# Patient Record
Sex: Female | Born: 1985 | Race: Black or African American | Hispanic: No | Marital: Married | Smoking: Current every day smoker
Health system: Southern US, Community
[De-identification: ages and names within clinical notes are randomized; demographics above are authoritative.]

## PROBLEM LIST (undated history)

## (undated) DIAGNOSIS — N159 Renal tubulo-interstitial disease, unspecified: Secondary | ICD-10-CM

## (undated) DIAGNOSIS — A0472 Enterocolitis due to Clostridium difficile, not specified as recurrent: Secondary | ICD-10-CM

---

## 2008-03-29 ENCOUNTER — Emergency Department (HOSPITAL_COMMUNITY): Admission: EM | Admit: 2008-03-29 | Discharge: 2008-03-29 | Payer: Self-pay | Admitting: Emergency Medicine

## 2008-05-18 ENCOUNTER — Ambulatory Visit (HOSPITAL_COMMUNITY): Admission: RE | Admit: 2008-05-18 | Discharge: 2008-05-18 | Payer: Self-pay | Admitting: Family Medicine

## 2008-06-15 ENCOUNTER — Ambulatory Visit: Payer: Self-pay | Admitting: *Deleted

## 2008-06-15 ENCOUNTER — Inpatient Hospital Stay (HOSPITAL_COMMUNITY): Admission: AD | Admit: 2008-06-15 | Discharge: 2008-06-15 | Payer: Self-pay | Admitting: Obstetrics & Gynecology

## 2008-08-13 ENCOUNTER — Inpatient Hospital Stay (HOSPITAL_COMMUNITY): Admission: AD | Admit: 2008-08-13 | Discharge: 2008-08-13 | Payer: Self-pay | Admitting: Obstetrics and Gynecology

## 2008-09-22 ENCOUNTER — Inpatient Hospital Stay (HOSPITAL_COMMUNITY): Admission: AD | Admit: 2008-09-22 | Discharge: 2008-09-22 | Payer: Self-pay | Admitting: Obstetrics and Gynecology

## 2008-10-12 ENCOUNTER — Inpatient Hospital Stay (HOSPITAL_COMMUNITY): Admission: AD | Admit: 2008-10-12 | Discharge: 2008-10-14 | Payer: Self-pay | Admitting: Obstetrics and Gynecology

## 2008-10-12 ENCOUNTER — Encounter (INDEPENDENT_AMBULATORY_CARE_PROVIDER_SITE_OTHER): Payer: Self-pay | Admitting: Obstetrics and Gynecology

## 2011-04-19 ENCOUNTER — Emergency Department (HOSPITAL_COMMUNITY)
Admission: EM | Admit: 2011-04-19 | Discharge: 2011-04-20 | Discharge status: Against Medical Advice | Payer: Self-pay | Attending: Emergency Medicine | Admitting: Emergency Medicine

## 2011-04-19 DIAGNOSIS — J111 Influenza due to unidentified influenza virus with other respiratory manifestations: Secondary | ICD-10-CM | POA: Insufficient documentation

## 2011-05-05 NOTE — H&P (Signed)
Olivia, Adams              ACCOUNT NO.:  [id number]   MEDICAL RECORD NO.:  [id number]          PATIENT TYPE:  INP   LOCATION:  9175                          FACILITY:  [facility]   PHYSICIAN:  Crist Fat. Rivard, M.D. DATE OF BIRTH:  1986/04/30   DATE OF ADMISSION:  10/12/2008  DATE OF DISCHARGE:                              HISTORY & PHYSICAL   Olivia Adams is a 25 year old gravida 1, para 0 at 40-3/7 weeks who  presents today with uterine contractions every 5 minutes x several  hours.  She denies any leaking or bleeding, and reports positive fetal  movement.  Pregnancy has been remarkable for:   1. Mild polyhydramnios noted on ultrasound last week.  2. Marginal cord insertion with normal growth on ultrasound.  3. Former smoker.  4. Negative group B strep.   PRENATAL LABORATORIES:  Blood type is O+, Rh antibody negative, VDRL  nonreactive, rubella titer positive, hepatitis B surface antigen  negative.  Hemoglobin electrophoresis was normal.  Varicella titer was  immune.  HIV was nonreactive.  GC and chlamydia cultures were negative  in May and at 36 weeks.  Pap was normal in May.  Urine culture in May  was normal.  Quadruple screen was within normal limits.  Hemoglobin upon  entering the practice was 10.8.  It was 10.4 at 28 weeks.  Glucola was  normal.  Group B strep culture was negative at 36 weeks.   HISTORY OF PRESENT PREGNANCY:  The patient transferred to [facility] for care in July at approximately 27 weeks. Previous to that  she had been followed at the [facility].  She had  originally presented for care there at 19 weeks.  She had an ultrasound  showing a marginal cord insertion.  She was treated for yeast and a UTI  at 23 weeks.  She had  another ultrasound through the health department.  She had initial ultrasound in April at 12 weeks 2 days showing a  marginal placenta previa.  On May 29th she had another ultrasound at 19  weeks and 3  days showing normal growth and marginal cord insertion was  noted and the previa was noted to be resolved.  She then transferred to  [facility].  She had a Glucola which was normal.  She had  had another ultrasound at 30 weeks showing growth at the 51st percentile  and fluid at the 55th percentile, normal cervical length, and the  marginal cord insertion was still noted.  She had another ultrasound at  34-6/7 weeks with normal growth, normal fluid, and marginal cord  insertion still noted.  Group B strep culture was negative.  Her last  ultrasound was last week showing mild polyhydramnios and normal growth.   OBSTETRICAL HISTORY:  The patient is a primigravida.   MEDICAL HISTORY:  She reports the usual childhood illnesses.  She is a  previous Lo Ovral and condom user.  She has had yeast infections x1.  She has a history of a kidney infection high school.   ALLERGIES:  She has no known medication  allergies.   FAMILY HISTORY:  Maternal grandfather had congestive heart failure.  Paternal grandmother had congestive heart failure.  Maternal  grandfather, maternal grandmother, maternal aunt and uncle have  hypertension.  Paternal grandfather is anemic, maternal aunt has  emphysema.  Maternal grandfather, maternal aunt and uncle are type 2  diabetics.  Maternal 1st cousin is hypothyroid.  Her mother had fainting  spells when younger; had surgery on the brain stem  which put pressure  on the spinal cord.  Maternal aunt had HIV.  Maternal aunt also had some  type of cancer.  Paternal aunt had questionable breast cancer.  Her  father was a smoker.   SOCIAL HISTORY:  The patient is single.  The father of the baby is  involved and supportive.  His name is [name].  The patient is a  Holiday representative in college.  Her partner has a bachelor's degree.  He is  currently employed.  The patient is [place] of the [place]  faith.  She is a former smoker.  She denies any alcohol,  drug or tobacco  since knowing she was pregnant.   PHYSICAL EXAMINATION:  VITAL SIGNS:  Stable.  The patient is afebrile.  HEENT:  Within normal limits.  LUNGS:  Breath sounds are clear.  HEART:  Regular rate and rhythm without murmur.  BREASTS:  Soft and nontender.  ABDOMEN:  Fundal height is approximately 38 cm.  Estimated fetal weight  7 to 7-1/2 pounds.  Uterine contractions are every 5 minutes, moderate  quality.  Cervix is 3-4 cm, 100% vertex at a -1 station and well-applied  with bulging bag of water.  Fetal heart rate is reactive with no  decelerations.  EXTREMITIES:  Deep tendon reflexes are 2+ without clonus.  There is a  trace edema noted.   IMPRESSION:  1. Intrauterine pregnancy at 40-3/7 weeks.  2. Early labor.  3. Negative group B strep.  4. Mild polyhydramnios.   PLAN:  1. Admit to birthing suite for consult with Dr. Silverio Lay as      attending physician.  2. Routine certified nurse midwife orders.  3. Will plan placement of epidural per patient request and then      artificial rupture of membranes after that.      Renaldo Reel Emilee Hero, C.N.M.      Crist Fat Rivard, M.D.  Electronically Signed    VLL/MEDQ  D:  10/12/2008  T:  10/12/2008  Job:  [id number]

## 2011-09-17 LAB — URINALYSIS, ROUTINE W REFLEX MICROSCOPIC
Glucose, UA: NEGATIVE
Hgb urine dipstick: NEGATIVE
Specific Gravity, Urine: 1.015

## 2011-09-17 LAB — GC/CHLAMYDIA PROBE AMP, GENITAL: GC Probe Amp, Genital: NEGATIVE

## 2011-09-17 LAB — WET PREP, GENITAL
Clue Cells Wet Prep HPF POC: NONE SEEN
Trich, Wet Prep: NONE SEEN
Yeast Wet Prep HPF POC: NONE SEEN

## 2011-09-21 LAB — RPR: RPR Ser Ql: NONREACTIVE

## 2011-09-21 LAB — CBC
HCT: 29.3 — ABNORMAL LOW
MCHC: 33.4
Platelets: 154
Platelets: 184
RBC: 3.68 — ABNORMAL LOW
WBC: 6.8
WBC: 8.2

## 2011-09-21 LAB — URINE MICROSCOPIC-ADD ON

## 2011-09-21 LAB — URINALYSIS, ROUTINE W REFLEX MICROSCOPIC
Glucose, UA: NEGATIVE
Hgb urine dipstick: NEGATIVE
Specific Gravity, Urine: <1.005 — ABNORMAL LOW

## 2012-12-08 ENCOUNTER — Encounter (HOSPITAL_COMMUNITY): Payer: Self-pay

## 2012-12-08 ENCOUNTER — Emergency Department (HOSPITAL_COMMUNITY)
Admission: EM | Admit: 2012-12-08 | Discharge: 2012-12-08 | Disposition: A | Discharge status: Home / Self Care | Payer: 59 | Attending: Emergency Medicine | Admitting: Emergency Medicine

## 2012-12-08 DIAGNOSIS — F17200 Nicotine dependence, unspecified, uncomplicated: Secondary | ICD-10-CM | POA: Insufficient documentation

## 2012-12-08 DIAGNOSIS — N390 Urinary tract infection, site not specified: Secondary | ICD-10-CM | POA: Insufficient documentation

## 2012-12-08 DIAGNOSIS — Z975 Presence of (intrauterine) contraceptive device: Secondary | ICD-10-CM | POA: Insufficient documentation

## 2012-12-08 DIAGNOSIS — R1031 Right lower quadrant pain: Secondary | ICD-10-CM | POA: Insufficient documentation

## 2012-12-08 DIAGNOSIS — Z87448 Personal history of other diseases of urinary system: Secondary | ICD-10-CM | POA: Insufficient documentation

## 2012-12-08 DIAGNOSIS — R109 Unspecified abdominal pain: Secondary | ICD-10-CM

## 2012-12-08 HISTORY — DX: Renal tubulo-interstitial disease, unspecified: N15.9

## 2012-12-08 LAB — CBC WITH DIFFERENTIAL/PLATELET
Hemoglobin: 13 g/dL (ref 12.0–15.0)
Lymphocytes Relative: 47 % — ABNORMAL HIGH (ref 12–46)
Lymphs Abs: 2.5 10*3/uL (ref 0.7–4.0)
MCH: 31.5 pg (ref 26.0–34.0)
Monocytes Relative: 7 % (ref 3–12)
Neutro Abs: 2.4 10*3/uL (ref 1.7–7.7)
Neutrophils Relative %: 44 % (ref 43–77)
Platelets: 278 10*3/uL (ref 150–400)
RBC: 4.13 MIL/uL (ref 3.87–5.11)
WBC: 5.4 10*3/uL (ref 4.0–10.5)

## 2012-12-08 LAB — URINALYSIS, ROUTINE W REFLEX MICROSCOPIC
Bilirubin Urine: NEGATIVE
Nitrite: POSITIVE — AB
Specific Gravity, Urine: 1.019 (ref 1.005–1.030)
pH: 7.5 (ref 5.0–8.0)

## 2012-12-08 LAB — COMPREHENSIVE METABOLIC PANEL
ALT: 11 U/L (ref 0–35)
Alkaline Phosphatase: 62 U/L (ref 39–117)
BUN: 10 mg/dL (ref 6–23)
CO2: 25 mEq/L (ref 19–32)
Chloride: 106 mEq/L (ref 96–112)
GFR calc Af Amer: >90 mL/min (ref >90)
Glucose, Bld: 94 mg/dL (ref 70–99)
Potassium: 3.2 mEq/L — ABNORMAL LOW (ref 3.5–5.1)
Sodium: 140 mEq/L (ref 135–145)
Total Bilirubin: 0.4 mg/dL (ref 0.3–1.2)

## 2012-12-08 LAB — WET PREP, GENITAL
Clue Cells Wet Prep HPF POC: NONE SEEN
Trich, Wet Prep: NONE SEEN
Yeast Wet Prep HPF POC: NONE SEEN

## 2012-12-08 LAB — GC/CHLAMYDIA PROBE AMP
CT Probe RNA: NEGATIVE
GC Probe RNA: NEGATIVE

## 2012-12-08 LAB — URINE MICROSCOPIC-ADD ON

## 2012-12-08 MED ORDER — NITROFURANTOIN MONOHYD MACRO 100 MG PO CAPS: 100.0000 mg | ORAL_CAPSULE | Freq: Two times a day (BID) | ORAL | Status: DC

## 2012-12-08 MED ORDER — POTASSIUM CHLORIDE CRYS ER 20 MEQ PO TBCR
40.0000 meq | EXTENDED_RELEASE_TABLET | Freq: Once | ORAL | Status: AC
  Administered 2012-12-08: 40 meq via ORAL
  Filled 2012-12-08: qty 2

## 2012-12-08 NOTE — ED Notes (Signed)
Pt states she has an IUD x 4 years.  Pt states had rt back pain yesterday and what felt like kidney pain.  Pt states drank a lot of water.  That pain has eased up.  Today states that she went out and had pizza.  Had intercourse and about 15 minutes later started having severe abdominal pain.  No spotting or bleeding.  No fever.  Odor with urination noted.

## 2012-12-08 NOTE — ED Notes (Signed)
YNW:[id number]<[id number]> Expected date:<BR> Expected time:<BR> Means of arrival:<BR> Comments:<BR> BED 7

## 2012-12-08 NOTE — ED Provider Notes (Signed)
History     CSN: [id number]  Arrival date & time 12/08/12  0129   First MD Initiated Contact with Patient 12/08/12 [phone]      Chief Complaint  Patient presents with  . Abdominal Pain    (Consider location/radiation/quality/duration/timing/severity/associated sxs/prior treatment) HPI Comments: Ms. Olivia Adams presents for evaluation of lower abdominal pain.  She first experienced pain 2 days ago along her right back and flank.  It was dull and resolved after several hours.  Tonight she had sexual intercourse.  Immediately afterwards she reports severe, sharp right lower abdominal pain.  She denies fever, NVD, constipation, dysuria, flank/back pain, abnormal discharges, vaginal bleeding, and hematuria.  She has a mirena IUD in place and has not had a regular period in over 4 years.  Patient is a 26 y.o. female presenting with abdominal pain. The history is provided by the patient. No language interpreter was used.  Abdominal Pain The primary symptoms of the illness include abdominal pain. The primary symptoms of the illness do not include fever, fatigue, shortness of breath, nausea, vomiting, diarrhea, hematemesis, hematochezia, dysuria, vaginal discharge or vaginal bleeding. The onset of the illness was sudden. The problem has been gradually improving.  The illness is associated with recent sexual activity. The patient states that she believes she is currently not pregnant. The patient has not had a change in bowel habit. Symptoms associated with the illness do not include chills, anorexia, diaphoresis, heartburn, constipation, urgency, hematuria, frequency or back pain.    Past Medical History  Diagnosis Date  . Kidney infection     History reviewed. No pertinent past surgical history.  History reviewed. No pertinent family history.  History  Substance Use Topics  . Smoking status: Current Every Day Smoker -- 0.5 packs/day    Types: Cigarettes  . Smokeless tobacco: Not on file  .  Alcohol Use: Yes     Comment: daily    OB History    Grav Para Term Preterm Abortions TAB SAB Ect Mult Living                  Review of Systems  Constitutional: Negative for fever, chills, diaphoresis and fatigue.  Respiratory: Negative for shortness of breath.   Gastrointestinal: Positive for abdominal pain. Negative for heartburn, nausea, vomiting, diarrhea, constipation, hematochezia, anorexia and hematemesis.  Genitourinary: Negative for dysuria, urgency, frequency, hematuria, vaginal bleeding and vaginal discharge.  Musculoskeletal: Negative for back pain.  All other systems reviewed and are negative.    Allergies  Review of patient's allergies indicates no known allergies.  Home Medications   Current Outpatient Rx  Name  Route  Sig  Dispense  Refill  . LEVONORGESTREL 20 MCG/24HR IU IUD   Intrauterine   1 each by Intrauterine route once.           BP 115/73  Pulse 111  Temp 98.9 F (37.2 C) (Oral)  Resp 22  SpO2 100%  Physical Exam  Nursing note and vitals reviewed. Constitutional: She is oriented to person, place, and time. She appears well-developed and well-nourished. No distress.  HENT:  Head: Normocephalic and atraumatic.  Right Ear: External ear normal.  Left Ear: External ear normal.  Nose: Nose normal.  Mouth/Throat: Oropharynx is clear and moist. No oropharyngeal exudate.  Eyes: Conjunctivae normal are normal. Pupils are equal, round, and reactive to light. Right eye exhibits no discharge. Left eye exhibits no discharge. No scleral icterus.  Neck: Normal range of motion. Neck supple. No JVD present. No tracheal  deviation present.  Cardiovascular: Normal rate, regular rhythm, normal heart sounds and intact distal pulses.  Exam reveals no gallop and no friction rub.   No murmur heard. Pulmonary/Chest: Effort normal and breath sounds normal. No stridor. No respiratory distress. She has no wheezes. She has no rales. She exhibits no tenderness.   Abdominal: Soft. Bowel sounds are normal. She exhibits no distension and no mass. There is no hepatosplenomegaly. There is tenderness in the right lower quadrant. There is no rigidity, no rebound, no guarding, no CVA tenderness and negative Murphy's sign. No hernia. Hernia confirmed negative in the right inguinal area and confirmed negative in the left inguinal area.  Genitourinary: Vagina normal and uterus normal. Rectal exam shows no external hemorrhoid. No labial fusion. There is no rash, tenderness, lesion or injury on the right labia. There is no rash, tenderness, lesion or injury on the left labia. Uterus is not deviated, not enlarged, not fixed and not tender. Cervix exhibits no motion tenderness and no discharge. Right adnexum displays no mass, no tenderness and no fullness. Left adnexum displays no mass, no tenderness and no fullness. No erythema, tenderness or bleeding around the vagina. No foreign body around the vagina. No signs of injury around the vagina. No vaginal discharge found.  Musculoskeletal: Normal range of motion. She exhibits no edema and no tenderness.  Lymphadenopathy:    She has no cervical adenopathy.       Right: No inguinal adenopathy present.       Left: No inguinal adenopathy present.  Neurological: She is alert and oriented to person, place, and time. No cranial nerve deficit.  Skin: Skin is warm and dry. No rash noted. She is not diaphoretic. No erythema. No pallor.  Psychiatric: She has a normal mood and affect. Her behavior is normal.    ED Course  Procedures (including critical care time)  Labs Reviewed  URINALYSIS, ROUTINE W REFLEX MICROSCOPIC - Abnormal; Notable for the following:    APPearance CLOUDY (*)     Hgb urine dipstick SMALL (*)     Ketones, ur TRACE (*)     Nitrite POSITIVE (*)     Leukocytes, UA MODERATE (*)     All other components within normal limits  CBC WITH DIFFERENTIAL - Abnormal; Notable for the following:    Lymphocytes Relative 47  (*)     All other components within normal limits  URINE MICROSCOPIC-ADD ON - Abnormal; Notable for the following:    Bacteria, UA MANY (*)     All other components within normal limits  COMPREHENSIVE METABOLIC PANEL  URINE CULTURE  GC/CHLAMYDIA PROBE AMP  WET PREP, GENITAL   No results found.   No diagnosis found.    MDM  Pt presents for evaluation of lowe abdominal pain.  She appears afebrile, nontoxic, note mildly elevated HR on arrival (resolved prior to my exam), NAD.  She has vague lower abdominal tenderness that is shaded towards the right side.  Plan perform a pelvic exam and basic belly labs.  Will provide symptomatic care prn and reassess. 1730.  Pt stable, pain free.  Urine culture is pending.  Pt has no clinical evidence of appendicitis.  Note an abnl U/A.  Plan treat with a course of macrobid.      Tobin Chad, MD 12/08/12 [phone]

## 2012-12-10 LAB — URINE CULTURE: Colony Count: >=100000

## 2012-12-11 NOTE — ED Notes (Signed)
+  Urine. Patient treated with Macrobid. Sensitive to same. Per protocol MD. °

## 2014-02-19 ENCOUNTER — Other Ambulatory Visit: Payer: Self-pay | Admitting: Family Medicine

## 2014-02-19 ENCOUNTER — Other Ambulatory Visit (HOSPITAL_COMMUNITY)
Admission: RE | Admit: 2014-02-19 | Discharge: 2014-02-19 | Disposition: A | Discharge status: Home / Self Care | Payer: 59 | Source: Ambulatory Visit | Attending: Family Medicine | Admitting: Family Medicine

## 2014-02-19 DIAGNOSIS — Z0000 Encounter for general adult medical examination without abnormal findings: Secondary | ICD-10-CM | POA: Insufficient documentation

## 2014-11-05 ENCOUNTER — Emergency Department (HOSPITAL_BASED_OUTPATIENT_CLINIC_OR_DEPARTMENT_OTHER)
Admission: EM | Admit: 2014-11-05 | Discharge: 2014-11-05 | Disposition: A | Discharge status: Home / Self Care | Payer: 59 | Attending: Emergency Medicine | Admitting: Emergency Medicine

## 2014-11-05 ENCOUNTER — Encounter (HOSPITAL_BASED_OUTPATIENT_CLINIC_OR_DEPARTMENT_OTHER): Payer: Self-pay | Admitting: *Deleted

## 2014-11-05 ENCOUNTER — Emergency Department (HOSPITAL_BASED_OUTPATIENT_CLINIC_OR_DEPARTMENT_OTHER): Payer: 59

## 2014-11-05 DIAGNOSIS — R112 Nausea with vomiting, unspecified: Secondary | ICD-10-CM | POA: Diagnosis not present

## 2014-11-05 DIAGNOSIS — R630 Anorexia: Secondary | ICD-10-CM | POA: Diagnosis not present

## 2014-11-05 DIAGNOSIS — Z3202 Encounter for pregnancy test, result negative: Secondary | ICD-10-CM | POA: Diagnosis not present

## 2014-11-05 DIAGNOSIS — R197 Diarrhea, unspecified: Secondary | ICD-10-CM | POA: Insufficient documentation

## 2014-11-05 DIAGNOSIS — R1011 Right upper quadrant pain: Secondary | ICD-10-CM | POA: Insufficient documentation

## 2014-11-05 DIAGNOSIS — Z79899 Other long term (current) drug therapy: Secondary | ICD-10-CM | POA: Diagnosis not present

## 2014-11-05 DIAGNOSIS — R1013 Epigastric pain: Secondary | ICD-10-CM | POA: Diagnosis not present

## 2014-11-05 DIAGNOSIS — Z87440 Personal history of urinary (tract) infections: Secondary | ICD-10-CM | POA: Diagnosis not present

## 2014-11-05 DIAGNOSIS — R6881 Early satiety: Secondary | ICD-10-CM | POA: Diagnosis not present

## 2014-11-05 DIAGNOSIS — Z720 Tobacco use: Secondary | ICD-10-CM | POA: Insufficient documentation

## 2014-11-05 LAB — COMPREHENSIVE METABOLIC PANEL
ALK PHOS: 56 U/L (ref 39–117)
ALT: 7 U/L (ref 0–35)
ANION GAP: 13 (ref 5–15)
AST: 16 U/L (ref 0–37)
Albumin: 3.9 g/dL (ref 3.5–5.2)
BUN: 11 mg/dL (ref 6–23)
CO2: 26 meq/L (ref 19–32)
Calcium: 9.2 mg/dL (ref 8.4–10.5)
Chloride: 103 mEq/L (ref 96–112)
Creatinine, Ser: 0.6 mg/dL (ref 0.50–1.10)
GLUCOSE: 78 mg/dL (ref 70–99)
POTASSIUM: 3.7 meq/L (ref 3.7–5.3)
Sodium: 142 mEq/L (ref 137–147)
TOTAL PROTEIN: 6.7 g/dL (ref 6.0–8.3)
Total Bilirubin: 0.7 mg/dL (ref 0.3–1.2)

## 2014-11-05 LAB — URINE MICROSCOPIC-ADD ON

## 2014-11-05 LAB — PREGNANCY, URINE: PREG TEST UR: NEGATIVE

## 2014-11-05 LAB — LIPASE, BLOOD: Lipase: 22 U/L (ref 11–59)

## 2014-11-05 LAB — URINALYSIS, ROUTINE W REFLEX MICROSCOPIC
Bilirubin Urine: NEGATIVE
GLUCOSE, UA: NEGATIVE mg/dL
Hgb urine dipstick: NEGATIVE
Ketones, ur: NEGATIVE mg/dL
NITRITE: NEGATIVE
PH: 8.5 — AB (ref 5.0–8.0)
PROTEIN: 30 mg/dL — AB
Specific Gravity, Urine: 1.021 (ref 1.005–1.030)
Urobilinogen, UA: 1 mg/dL (ref 0.0–1.0)

## 2014-11-05 MED ORDER — PANTOPRAZOLE SODIUM 40 MG PO TBEC
40.0000 mg | DELAYED_RELEASE_TABLET | Freq: Once | ORAL | Status: AC
  Administered 2014-11-05: 40 mg via ORAL
  Filled 2014-11-05: qty 1

## 2014-11-05 MED ORDER — GI COCKTAIL ~~LOC~~
30.0000 mL | Freq: Once | ORAL | Status: AC
  Administered 2014-11-05: 30 mL via ORAL
  Filled 2014-11-05: qty 30

## 2014-11-05 MED ORDER — PANTOPRAZOLE SODIUM 20 MG PO TBEC: 40.0000 mg | DELAYED_RELEASE_TABLET | Freq: Every day | ORAL | Status: DC

## 2014-11-05 NOTE — Discharge Instructions (Signed)

## 2014-11-05 NOTE — ED Notes (Signed)
Pt remains in ultrasound.

## 2014-11-05 NOTE — ED Provider Notes (Signed)
CSN: [id number]     Arrival date & time 11/05/14  1500 History   First MD Initiated Contact with Patient 11/05/14 1508     No chief complaint on file.    (Consider location/radiation/quality/duration/timing/severity/associated sxs/prior Treatment) Patient is a 28 y.o. female presenting with abdominal pain. The history is provided by the patient. No language interpreter was used.  Abdominal Pain Pain location:  Epigastric Pain quality: aching, cramping and sharp   Pain radiates to:  Does not radiate Pain severity:  Moderate Onset quality:  Sudden Duration:  2 weeks (10min -1hr) Timing:  Intermittent Progression:  Waxing and waning Chronicity:  New Context: not alcohol use, not awakening from sleep, not diet changes, not eating, not laxative use, not previous surgeries, not recent illness, not recent sexual activity, not recent travel, not retching, not sick contacts, not suspicious food intake and not trauma   Relieved by:  Nothing Worsened by:  Eating Ineffective treatments: xanax, celexa. Associated symptoms: anorexia, diarrhea, nausea and vomiting   Associated symptoms: no chest pain, no chills, no cough, no dysuria, no fatigue, no fever, no hematuria, no shortness of breath, no sore throat, no vaginal bleeding and no vaginal discharge   Risk factors: no alcohol abuse, no aspirin use, not elderly, has not had multiple surgeries, no NSAID use, not obese, not pregnant and no recent hospitalization     Past Medical History  Diagnosis Date  . Kidney infection    History reviewed. No pertinent past surgical history. History reviewed. No pertinent family history. History  Substance Use Topics  . Smoking status: Current Every Day Smoker -- 0.50 packs/day    Types: Cigarettes  . Smokeless tobacco: Not on file  . Alcohol Use: Yes     Comment: daily   OB History    No data available     Review of Systems  Constitutional: Negative for fever, chills, diaphoresis, activity  change, appetite change and fatigue.  HENT: Negative for congestion, facial swelling, rhinorrhea and sore throat.   Eyes: Negative for photophobia and discharge.  Respiratory: Negative for cough, chest tightness and shortness of breath.   Cardiovascular: Negative for chest pain, palpitations and leg swelling.  Gastrointestinal: Positive for nausea, vomiting, abdominal pain, diarrhea and anorexia.  Endocrine: Negative for polydipsia and polyuria.  Genitourinary: Negative for dysuria, frequency, hematuria, vaginal bleeding, vaginal discharge, difficulty urinating and pelvic pain.  Musculoskeletal: Negative for back pain, arthralgias, neck pain and neck stiffness.  Skin: Negative for color change and wound.  Allergic/Immunologic: Negative for immunocompromised state.  Neurological: Negative for facial asymmetry, weakness, numbness and headaches.  Hematological: Does not bruise/bleed easily.  Psychiatric/Behavioral: Negative for confusion and agitation.      Allergies  Review of patient's allergies indicates no known allergies.  Home Medications   Prior to Admission medications   Medication Sig Start Date End Date Taking? Authorizing Provider  ALPRAZolam Prudy Feeler(XANAX) 0.5 MG tablet Take 0.5 mg by mouth at bedtime as needed for anxiety.   Yes Historical Provider, MD  citalopram (CELEXA) 10 MG tablet Take 10 mg by mouth daily.   Yes Historical Provider, MD  ondansetron (ZOFRAN) 4 MG tablet Take 4 mg by mouth every 8 (eight) hours as needed for nausea or vomiting.   Yes Historical Provider, MD  levonorgestrel (MIRENA) 20 MCG/24HR IUD 1 each by Intrauterine route once.    Historical Provider, MD  pantoprazole (PROTONIX) 20 MG tablet Take 2 tablets (40 mg total) by mouth daily. 11/05/14   Toy CookeyMegan Neleh Muldoon, MD  BP 107/67 mmHg  Pulse 68  Temp(Src) 98.3 F (36.8 C) (Oral)  Resp 16  Ht 5\' 5"  (1.651 m)  Wt 117 lb (53.071 kg)  BMI 19.47 kg/m2  SpO2 100% Physical Exam  Constitutional: She is  oriented to person, place, and time. She appears well-developed and well-nourished. No distress.  HENT:  Head: Normocephalic and atraumatic.  Mouth/Throat: No oropharyngeal exudate.  Eyes: Pupils are equal, round, and reactive to light.  Neck: Normal range of motion. Neck supple.  Cardiovascular: Normal rate, regular rhythm and normal heart sounds.  Exam reveals no gallop and no friction rub.   No murmur heard. Pulmonary/Chest: Effort normal and breath sounds normal. No respiratory distress. She has no wheezes. She has no rales.  Abdominal: Soft. Bowel sounds are normal. She exhibits no distension and no mass. There is tenderness in the right upper quadrant. There is no rebound and no guarding.  Musculoskeletal: Normal range of motion. She exhibits no edema or tenderness.  Neurological: She is alert and oriented to person, place, and time.  Skin: Skin is warm and dry.  Psychiatric: She has a normal mood and affect.    ED Course  Procedures (including critical care time) Labs Review Labs Reviewed  URINALYSIS, ROUTINE W REFLEX MICROSCOPIC - Abnormal; Notable for the following:    APPearance CLOUDY (*)    pH 8.5 (*)    Protein, ur 30 (*)    Leukocytes, UA SMALL (*)    All other components within normal limits  URINE MICROSCOPIC-ADD ON - Abnormal; Notable for the following:    Bacteria, UA MANY (*)    All other components within normal limits  PREGNANCY, URINE  COMPREHENSIVE METABOLIC PANEL  LIPASE, BLOOD    Imaging Review US Abdomen Complete  11/05/2014   CLINICAL DATA:  Nausea, vomiting, early satiety for 2 weeks, 15 lb weight loss in 2 weeks, epigastric pain  EXAM: ULTRASOUND ABDOMEN COMPLETE  COMPARISON:  None  FINDINGS: Gallbladder: Normally distended without stones or wall thickening. No pericholecystic fluid or sonographic Murphy sign. Question minimal dependent sludge.  Common bile duct: Diameter: Normal caliber 1 mm diameter  Liver: Normal appearance  IVC: Normal appearance   Pancreas: Normal appearance  Spleen: Normal appearance, 6.8 cm length  Right Kidney: Length: 11.1 cm. Normal morphology without mass or hydronephrosis.  Left Kidney: Length: 11.8 cm. Normal morphology without mass or hydronephrosis.  Abdominal aorta: Normal caliber  Other findings: No free-fluid  IMPRESSION: No significant upper abdominal sonographic abnormalities identified.  Question minimal dependent sludge in gallbladder.   Electronically Signed   By: Ulyses Southward M.D.   On: 11/05/2014 17:23     EKG Interpretation None      MDM   Final diagnoses:  Epigastric pain  Early satiety  Nausea & vomiting    Pt is a 28 y.o. female with Pmhx as above who presents with about 2 weeks of 15 lb weight loss, nausea, vomiting, and epigastric pain, early saiety after eating. She also describes episodes og sudden onset sweating, n/v. She was seen 5 days ago by [facility] family physicians who gave her zofran, xanax, and switched her zoloft to celexa.  She continues to have symptoms. She denies sick contacts, recent abx use, travel history, or suspicious food intake.  On PE, VSS, pt in NAD. +epigastric ttp w/o rebound or guarding. Pt would like me to try to get labs from 5 days ago before drawing new labs.  Will contact [facility]. GI cocktail & protonix will be  given while waiting for labs.   CBC, CMP, h pylori grossly unremarkable from OSH records. Will repeat CMP, add lipase and get ab US.   US w/ possible small sludge, lipase, CMP nml. Urine contaminated. Preg negative. Sludge possible cause of symptoms, but i think gastritis/GERD more likely. Will start trial of protonix, have her f/u with PCP as scheduled on Wed and she acn f/u with [facility] if no improvement seen.       Toy CookeyMegan Lilibeth Opie, MD 11/05/14 [phone]

## 2014-11-05 NOTE — ED Notes (Signed)
Pt at US will get blood work after US is completed

## 2014-11-05 NOTE — ED Notes (Signed)
Pt c/o n/v/d x 1 4 days

## 2014-11-05 NOTE — ED Notes (Signed)
MD at bedside. 

## 2014-11-26 ENCOUNTER — Other Ambulatory Visit: Payer: Self-pay | Admitting: Gastroenterology

## 2014-11-29 ENCOUNTER — Other Ambulatory Visit (HOSPITAL_COMMUNITY): Payer: Self-pay | Admitting: Gastroenterology

## 2014-11-29 DIAGNOSIS — R1013 Epigastric pain: Secondary | ICD-10-CM

## 2014-12-06 ENCOUNTER — Ambulatory Visit (HOSPITAL_COMMUNITY): Payer: 59

## 2014-12-10 ENCOUNTER — Encounter (HOSPITAL_COMMUNITY): Payer: 59

## 2014-12-10 ENCOUNTER — Ambulatory Visit (HOSPITAL_COMMUNITY): Payer: 59

## 2015-01-02 ENCOUNTER — Encounter (HOSPITAL_BASED_OUTPATIENT_CLINIC_OR_DEPARTMENT_OTHER): Payer: Self-pay

## 2015-01-02 ENCOUNTER — Emergency Department (HOSPITAL_BASED_OUTPATIENT_CLINIC_OR_DEPARTMENT_OTHER)
Admission: EM | Admit: 2015-01-02 | Discharge: 2015-01-02 | Disposition: A | Discharge status: Home / Self Care | Payer: 59 | Attending: Emergency Medicine | Admitting: Emergency Medicine

## 2015-01-02 DIAGNOSIS — N390 Urinary tract infection, site not specified: Secondary | ICD-10-CM | POA: Insufficient documentation

## 2015-01-02 DIAGNOSIS — R197 Diarrhea, unspecified: Secondary | ICD-10-CM | POA: Insufficient documentation

## 2015-01-02 DIAGNOSIS — K219 Gastro-esophageal reflux disease without esophagitis: Secondary | ICD-10-CM | POA: Diagnosis not present

## 2015-01-02 DIAGNOSIS — Z87448 Personal history of other diseases of urinary system: Secondary | ICD-10-CM | POA: Insufficient documentation

## 2015-01-02 DIAGNOSIS — R55 Syncope and collapse: Secondary | ICD-10-CM | POA: Insufficient documentation

## 2015-01-02 DIAGNOSIS — Z3202 Encounter for pregnancy test, result negative: Secondary | ICD-10-CM | POA: Diagnosis not present

## 2015-01-02 DIAGNOSIS — Z79899 Other long term (current) drug therapy: Secondary | ICD-10-CM | POA: Insufficient documentation

## 2015-01-02 DIAGNOSIS — Z720 Tobacco use: Secondary | ICD-10-CM | POA: Diagnosis not present

## 2015-01-02 DIAGNOSIS — R112 Nausea with vomiting, unspecified: Secondary | ICD-10-CM | POA: Diagnosis not present

## 2015-01-02 LAB — CBC WITH DIFFERENTIAL/PLATELET
BASOS ABS: 0 10*3/uL (ref 0.0–0.1)
BASOS PCT: 1 % (ref 0–1)
EOS ABS: 0.2 10*3/uL (ref 0.0–0.7)
EOS PCT: 5 % (ref 0–5)
HCT: 36.9 % (ref 36.0–46.0)
Hemoglobin: 12.1 g/dL (ref 12.0–15.0)
Lymphocytes Relative: 38 % (ref 12–46)
Lymphs Abs: 1.8 10*3/uL (ref 0.7–4.0)
MCH: 31.3 pg (ref 26.0–34.0)
MCHC: 32.8 g/dL (ref 30.0–36.0)
MCV: 95.3 fL (ref 78.0–100.0)
MONO ABS: 0.5 10*3/uL (ref 0.1–1.0)
MONOS PCT: 9 % (ref 3–12)
NEUTROS ABS: 2.3 10*3/uL (ref 1.7–7.7)
Neutrophils Relative %: 47 % (ref 43–77)
PLATELETS: 212 10*3/uL (ref 150–400)
RBC: 3.87 MIL/uL (ref 3.87–5.11)
RDW: 12.2 % (ref 11.5–15.5)
WBC: 4.8 10*3/uL (ref 4.0–10.5)

## 2015-01-02 LAB — URINE MICROSCOPIC-ADD ON

## 2015-01-02 LAB — PREGNANCY, URINE: PREG TEST UR: NEGATIVE

## 2015-01-02 LAB — COMPREHENSIVE METABOLIC PANEL
ALBUMIN: 3.9 g/dL (ref 3.5–5.2)
ALT: 14 U/L (ref 0–35)
ANION GAP: 4 — AB (ref 5–15)
AST: 21 U/L (ref 0–37)
Alkaline Phosphatase: 52 U/L (ref 39–117)
BUN: 14 mg/dL (ref 6–23)
CALCIUM: 8.6 mg/dL (ref 8.4–10.5)
CO2: 27 mmol/L (ref 19–32)
CREATININE: 0.59 mg/dL (ref 0.50–1.10)
Chloride: 105 mEq/L (ref 96–112)
GFR calc Af Amer: >90 mL/min (ref >90)
GFR calc non Af Amer: >90 mL/min (ref >90)
Glucose, Bld: 107 mg/dL — ABNORMAL HIGH (ref 70–99)
Potassium: 3.3 mmol/L — ABNORMAL LOW (ref 3.5–5.1)
Sodium: 136 mmol/L (ref 135–145)
Total Bilirubin: 0.4 mg/dL (ref 0.3–1.2)
Total Protein: 6.6 g/dL (ref 6.0–8.3)

## 2015-01-02 LAB — URINALYSIS, ROUTINE W REFLEX MICROSCOPIC
Bilirubin Urine: NEGATIVE
Glucose, UA: NEGATIVE mg/dL
HGB URINE DIPSTICK: NEGATIVE
KETONES UR: NEGATIVE mg/dL
Leukocytes, UA: NEGATIVE
Nitrite: POSITIVE — AB
PROTEIN: NEGATIVE mg/dL
Specific Gravity, Urine: 1.025 (ref 1.005–1.030)
UROBILINOGEN UA: 1 mg/dL (ref 0.0–1.0)
pH: 6.5 (ref 5.0–8.0)

## 2015-01-02 MED ORDER — SODIUM CHLORIDE 0.9 % IV BOLUS (SEPSIS)
1000.0000 mL | Freq: Once | INTRAVENOUS | Status: AC
  Administered 2015-01-02: 1000 mL via INTRAVENOUS

## 2015-01-02 MED ORDER — CEPHALEXIN 500 MG PO CAPS: 500.0000 mg | ORAL_CAPSULE | Freq: Three times a day (TID) | ORAL | Status: DC

## 2015-01-02 MED ORDER — ACETAMINOPHEN 500 MG PO TABS
1000.0000 mg | ORAL_TABLET | Freq: Once | ORAL | Status: DC
  Filled 2015-01-02: qty 2

## 2015-01-02 NOTE — ED Notes (Signed)
Reports syncopal episode earlier today. Sts she has been losing weight recently less than 30 lbs. Has seen PMD, went to [facility] with no dx yet. Denies abdominal pain.

## 2015-01-02 NOTE — ED Provider Notes (Signed)
CSN: [id number]     Arrival date & time 01/02/15  1509 History   First MD Initiated Contact with Patient 01/02/15 1607     Chief Complaint  Patient presents with  . Loss of Consciousness     (Consider location/radiation/quality/duration/timing/severity/associated sxs/prior Treatment) HPI Comments: Patient presents to the ER for evaluation of syncope. Patient had a syncopal episode earlier today. Patient reports that she has been experiencing multiple medical problems recently. She has been experiencing abdominal pain. She was diagnosed with gallbladder sludge recently, but general surgery did not think that was causing her symptoms. [facility] She has seen a GI specialist and had upper endoscopy. She was told that she has severe GERD. She has been placed on multiple medications for these symptoms. Patient reports that she has had nausea, vomiting, diarrhea this week. After she passed out, her blood pressure was low. She normally runs in the 120 systolic range.  Patient is a 29 y.o. female presenting with syncope.  Loss of Consciousness Associated symptoms: vomiting     Past Medical History  Diagnosis Date  . Kidney infection    History reviewed. No pertinent past surgical history. No family history on file. History  Substance Use Topics  . Smoking status: Current Every Day Smoker -- 0.50 packs/day    Types: Cigarettes  . Smokeless tobacco: Not on file  . Alcohol Use: Yes     Comment: daily   OB History    No data available     Review of Systems  Cardiovascular: Positive for syncope.  Gastrointestinal: Positive for vomiting and diarrhea.  Neurological: Positive for syncope.  All other systems reviewed and are negative.     Allergies  Review of patient's allergies indicates no known allergies.  Home Medications   Prior to Admission medications   Medication Sig Start Date End Date Taking? Authorizing Provider  ALPRAZolam Prudy Feeler(XANAX) 0.5  MG tablet Take 0.5 mg by mouth at bedtime as needed for anxiety.   Yes Historical Provider, MD  citalopram (CELEXA) 10 MG tablet Take 10 mg by mouth daily.   Yes Historical Provider, MD  clonazePAM (KLONOPIN) 0.5 MG tablet Take 0.5 mg by mouth 2 (two) times daily as needed for anxiety.   Yes Historical Provider, MD  dicyclomine (BENTYL) 20 MG tablet Take 20 mg by mouth every 6 (six) hours.   Yes Historical Provider, MD  levonorgestrel (MIRENA) 20 MCG/24HR IUD 1 each by Intrauterine route once.   Yes Historical Provider, MD  omeprazole (PRILOSEC) 40 MG capsule Take 40 mg by mouth daily.   Yes Historical Provider, MD  pantoprazole (PROTONIX) 20 MG tablet Take 2 tablets (40 mg total) by mouth daily. 11/05/14  Yes Toy CookeyMegan Docherty, MD  sucralfate (CARAFATE) 1 G tablet Take 1 g by mouth 4 (four) times daily -  with meals and at bedtime.   Yes Historical Provider, MD  cephALEXin (KEFLEX) 500 MG capsule Take 1 capsule (500 mg total) by mouth 3 (three) times daily. 01/02/15   Gilda Creasehristopher J. Pollina, MD  ondansetron (ZOFRAN) 4 MG tablet Take 4 mg by mouth every 8 (eight) hours as needed for nausea or vomiting.    Historical Provider, MD   BP 97/62 mmHg  Pulse 67  Temp(Src) 98.6 F (37 C) (Oral)  Resp 16  Ht 5' 4.25" (1.632 m)  Wt 110 lb (49.896 kg)  BMI 18.73 kg/m2  SpO2 100% Physical Exam  Constitutional: She is oriented to person, place, and time. She  appears well-developed and well-nourished. No distress.  HENT:  Head: Normocephalic and atraumatic.  Right Ear: Hearing normal.  Left Ear: Hearing normal.  Nose: Nose normal.  Mouth/Throat: Oropharynx is clear and moist and mucous membranes are normal.  Eyes: Conjunctivae and EOM are normal. Pupils are equal, round, and reactive to light.  Neck: Normal range of motion. Neck supple.  Cardiovascular: Regular rhythm, S1 normal and S2 normal.  Exam reveals no gallop and no friction rub.   No murmur heard. Pulmonary/Chest: Effort normal and breath  sounds normal. No respiratory distress. She exhibits no tenderness.  Abdominal: Soft. Normal appearance and bowel sounds are normal. There is no hepatosplenomegaly. There is no tenderness. There is no rebound, no guarding, no tenderness at McBurney's point and negative Murphy's sign. No hernia.  Musculoskeletal: Normal range of motion.  Neurological: She is alert and oriented to person, place, and time. She has normal strength. No cranial nerve deficit or sensory deficit. Coordination normal. GCS eye subscore is 4. GCS verbal subscore is 5. GCS motor subscore is 6.  Skin: Skin is warm, dry and intact. No rash noted. No cyanosis.  Psychiatric: She has a normal mood and affect. Her speech is normal and behavior is normal. Thought content normal.  Nursing note and vitals reviewed.   ED Course  Procedures (including critical care time) Labs Review Labs Reviewed  COMPREHENSIVE METABOLIC PANEL - Abnormal; Notable for the following:    Potassium 3.3 (*)    Glucose, Bld 107 (*)    Anion gap 4 (*)    All other components within normal limits  URINALYSIS, ROUTINE W REFLEX MICROSCOPIC - Abnormal; Notable for the following:    APPearance CLOUDY (*)    Nitrite POSITIVE (*)    All other components within normal limits  URINE MICROSCOPIC-ADD ON - Abnormal; Notable for the following:    Bacteria, UA MANY (*)    All other components within normal limits  CBC WITH DIFFERENTIAL  PREGNANCY, URINE    Imaging Review No results found.   EKG Interpretation   Date/Time:  Wednesday January 02 2015 15:17:20 EST Ventricular Rate:  78 PR Interval:  154 QRS Duration: 74 QT Interval:  360 QTC Calculation: 410 R Axis:   69 Text Interpretation:  Normal sinus rhythm Low voltage QRS Nonspecific T  wave abnormality Abnormal ECG Confirmed by Fayrene Fearing  MD, MARK ([id number]) on  01/02/2015 3:19:46 PM      MDM   Final diagnoses:  Vasovagal syncope  UTI (lower urinary tract infection)    Patient presents to the  ER for evaluation of syncope. Patient has had ongoing issues with nausea, vomiting, diarrhea and abdominal pain. She is being worked up by GI for this. She does report vomiting and diarrhea over the course of this past week, but not today. Blood pressure was low, around 87 systolic after the syncopal episode. She likely has some element of dehydration. Blood pressures improved with IV fluids and she is not orthostatic. Workup reveals a urinalysis that is similar to a previous urinalysis that grew out Escherichia coli. Will treat empirically. Patient referred back to her GI specialist for ongoing workup of her GI symptoms.    Gilda Crease, MD 01/02/15 [id number]

## 2015-01-02 NOTE — Discharge Instructions (Signed)
Syncope °Syncope is a medical term for fainting or passing out. This means you lose consciousness and drop to the ground. People are generally unconscious for less than 5 minutes. You may have some muscle twitches for up to 15 seconds before waking up and returning to normal. Syncope occurs more often in older adults, but it can happen to anyone. While most causes of syncope are not dangerous, syncope can be a sign of a serious medical problem. It is important to seek medical care.  °CAUSES  °Syncope is caused by a sudden drop in blood flow to the brain. The specific cause is often not determined. Factors that can bring on syncope include: °· Taking medicines that lower blood pressure. °· Sudden changes in posture, such as standing up quickly. °· Taking more medicine than prescribed. °· Standing in one place for too long. °· Seizure disorders. °· Dehydration and excessive exposure to heat. °· Low blood sugar (hypoglycemia). °· Straining to have a bowel movement. °· Heart disease, irregular heartbeat, or other circulatory problems. °· Fear, emotional distress, seeing blood, or severe pain. °SYMPTOMS  °Right before fainting, you may: °· Feel dizzy or light-headed. °· Feel nauseous. °· See all white or all black in your field of vision. °· Have cold, clammy skin. °DIAGNOSIS  °Your health care provider will ask about your symptoms, perform a physical exam, and perform an electrocardiogram (ECG) to record the electrical activity of your heart. Your health care provider may also perform other heart or blood tests to determine the cause of your syncope which may include: °· Transthoracic echocardiogram (TTE). During echocardiography, sound waves are used to evaluate how blood flows through your heart. °· Transesophageal echocardiogram (TEE). °· Cardiac monitoring. This allows your health care provider to monitor your heart rate and rhythm in real time. °· Holter monitor. This is a portable device that records your  heartbeat and can help diagnose heart arrhythmias. It allows your health care provider to track your heart activity for several days, if needed. °· Stress tests by exercise or by giving medicine that makes the heart beat faster. °TREATMENT  °In most cases, no treatment is needed. Depending on the cause of your syncope, your health care provider may recommend changing or stopping some of your medicines. °HOME CARE INSTRUCTIONS °· Have someone stay with you until you feel stable. °· Do not drive, use machinery, or play sports until your health care provider says it is okay. °· Keep all follow-up appointments as directed by your health care provider. °· Lie down right away if you start feeling like you might faint. Breathe deeply and steadily. Wait until all the symptoms have passed. °· Drink enough fluids to keep your urine clear or pale yellow. °· If you are taking blood pressure or heart medicine, get up slowly and take several minutes to sit and then stand. This can reduce dizziness. °SEEK IMMEDIATE MEDICAL CARE IF:  °· You have a severe headache. °· You have unusual pain in the chest, abdomen, or back. °· You are bleeding from your mouth or rectum, or you have black or tarry stool. °· You have an irregular or very fast heartbeat. °· You have pain with breathing. °· You have repeated fainting or seizure-like jerking during an episode. °· You faint when sitting or lying down. °· You have confusion. °· You have trouble walking. °· You have severe weakness. °· You have vision problems. °If you fainted, call your local emergency services (911 in [place] Do not drive   yourself to the hospital.  °MAKE SURE YOU: °· Understand these instructions. °· Will watch your condition. °· Will get help right away if you are not doing well or get worse. °Document Released: 12/07/2005 Document Revised: 12/12/2013 Document Reviewed: 02/05/2012 °ExitCare® Patient Information ©2015 [employer]. This information is not intended to replace  advice given to you by your health care provider. Make sure you discuss any questions you have with your health care provider. ° °Urinary Tract Infection °Urinary tract infections (UTIs) can develop anywhere along your urinary tract. Your urinary tract is your body's drainage system for removing wastes and extra water. Your urinary tract includes two kidneys, two ureters, a bladder, and a urethra. Your kidneys are a pair of bean-shaped organs. Each kidney is about the size of your fist. They are located below your ribs, one on each side of your spine. °CAUSES °Infections are caused by microbes, which are microscopic organisms, including fungi, viruses, and bacteria. These organisms are so small that they can only be seen through a microscope. Bacteria are the microbes that most commonly cause UTIs. °SYMPTOMS  °Symptoms of UTIs may vary by age and gender of the patient and by the location of the infection. Symptoms in young women typically include a frequent and intense urge to urinate and a painful, burning feeling in the bladder or urethra during urination. Older women and men are more likely to be tired, shaky, and weak and have muscle aches and abdominal pain. A fever may mean the infection is in your kidneys. Other symptoms of a kidney infection include pain in your back or sides below the ribs, nausea, and vomiting. °DIAGNOSIS °To diagnose a UTI, your caregiver will ask you about your symptoms. Your caregiver also will ask to provide a urine sample. The urine sample will be tested for bacteria and white blood cells. White blood cells are made by your body to help fight infection. °TREATMENT  °Typically, UTIs can be treated with medication. Because most UTIs are caused by a bacterial infection, they usually can be treated with the use of antibiotics. The choice of antibiotic and length of treatment depend on your symptoms and the type of bacteria causing your infection. °HOME CARE INSTRUCTIONS °· If you were  prescribed antibiotics, take them exactly as your caregiver instructs you. Finish the medication even if you feel better after you have only taken some of the medication. °· Drink enough water and fluids to keep your urine clear or pale yellow. °· Avoid caffeine, tea, and carbonated beverages. They tend to irritate your bladder. °· Empty your bladder often. Avoid holding urine for long periods of time. °· Empty your bladder before and after sexual intercourse. °· After a bowel movement, women should cleanse from front to back. Use each tissue only once. °SEEK MEDICAL CARE IF:  °· You have back pain. °· You develop a fever. °· Your symptoms do not begin to resolve within 3 days. °SEEK IMMEDIATE MEDICAL CARE IF:  °· You have severe back pain or lower abdominal pain. °· You develop chills. °· You have nausea or vomiting. °· You have continued burning or discomfort with urination. °MAKE SURE YOU:  °· Understand these instructions. °· Will watch your condition. °· Will get help right away if you are not doing well or get worse. °Document Released: 09/16/2005 Document Revised: 06/07/2012 Document Reviewed: 01/15/2012 °ExitCare® Patient Information ©2015 [employer]. This information is not intended to replace advice given to you by your health care provider. Make sure   you discuss any questions you have with your health care provider. ° °

## 2015-01-02 NOTE — ED Notes (Signed)
Pt refused tylenol for now.

## 2015-01-02 NOTE — ED Notes (Addendum)
MD at bedside. 

## 2015-02-10 IMAGING — US US ABDOMEN COMPLETE
1 series · 14 of 25 positions shown · non-contrast
Comparison: None

CLINICAL DATA: Nausea, vomiting, early satiety for 2 weeks, 15 lb
weight loss in 2 weeks, epigastric pain

EXAM:
ULTRASOUND ABDOMEN COMPLETE

[Series 1: us abdomen complete · 0.18mm/px · 14 of 72 slices shown]
[im 1/72]
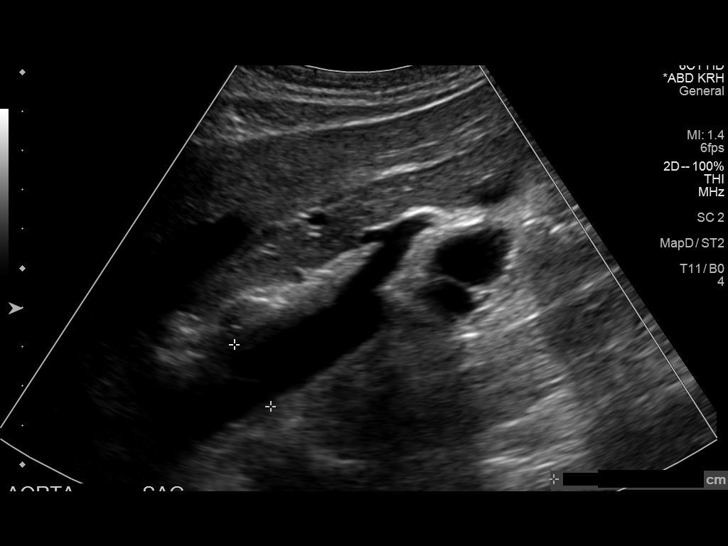
[im 6/72]
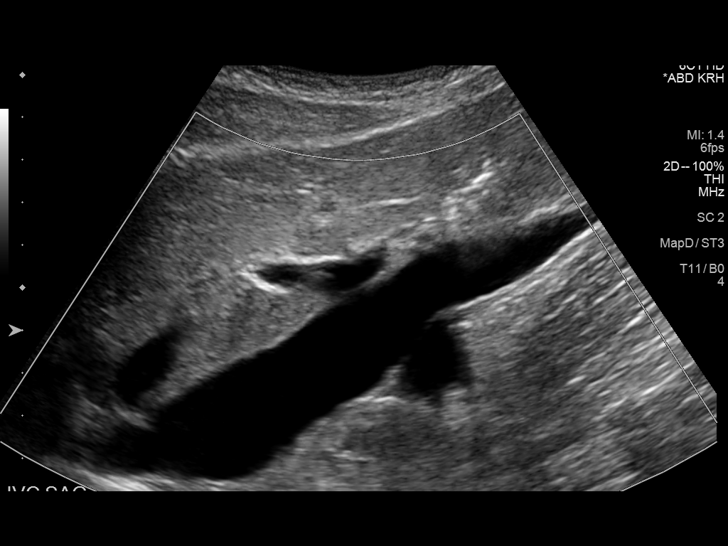
[im 12/72]
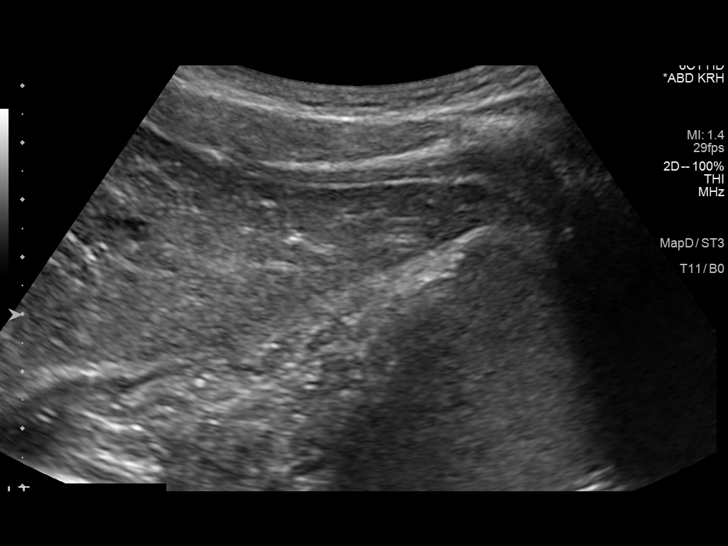
[im 18/72]
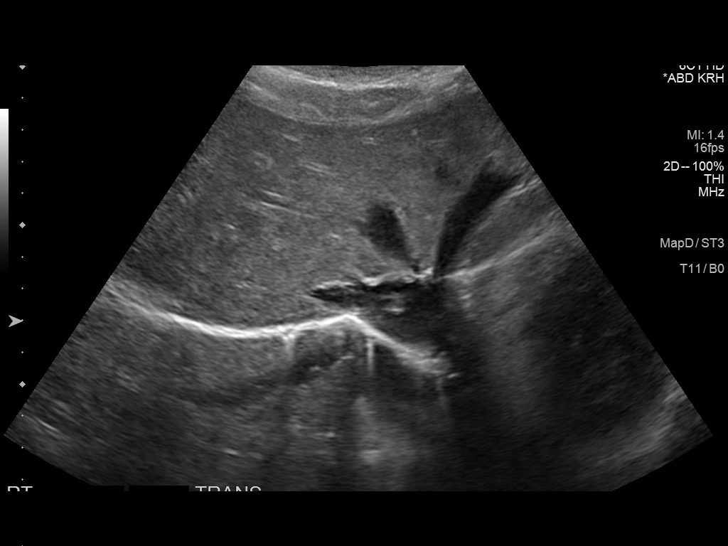
[im 24/72]
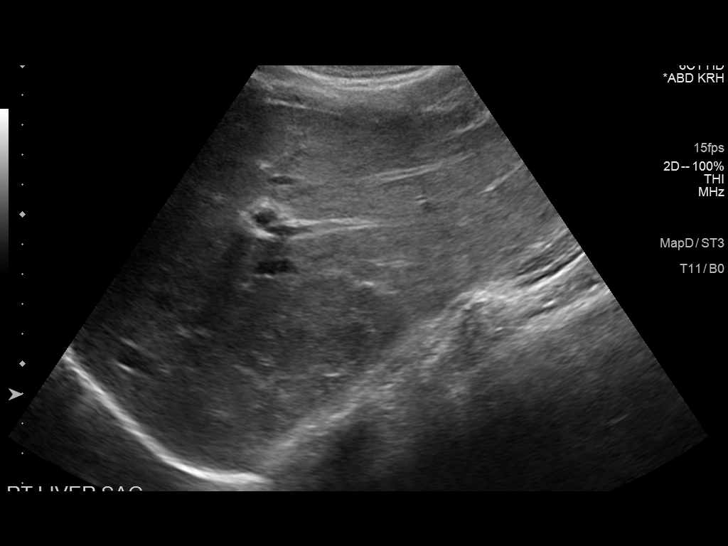
[im 27/72]
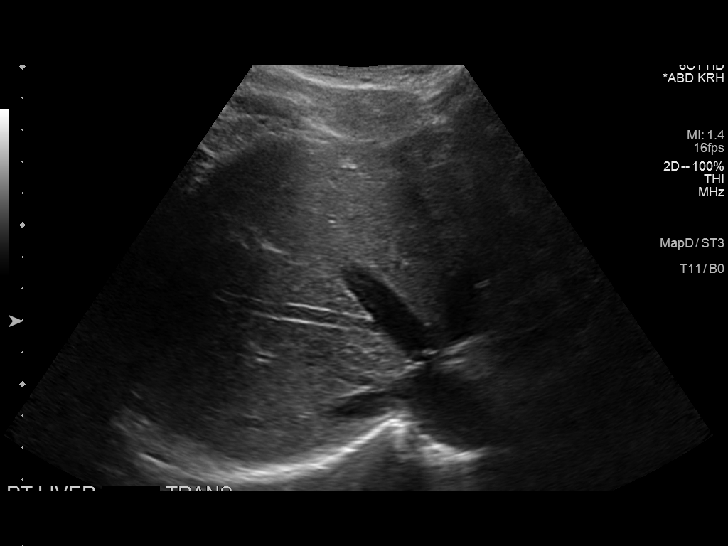
[im 33/72]
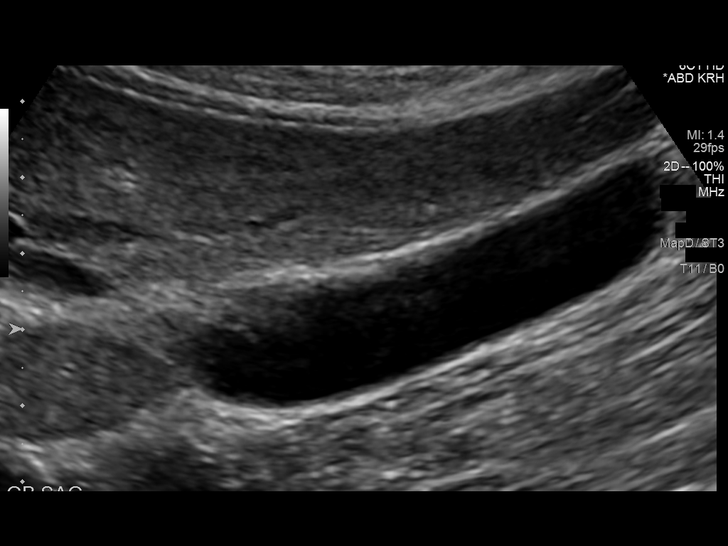
[im 39/72]
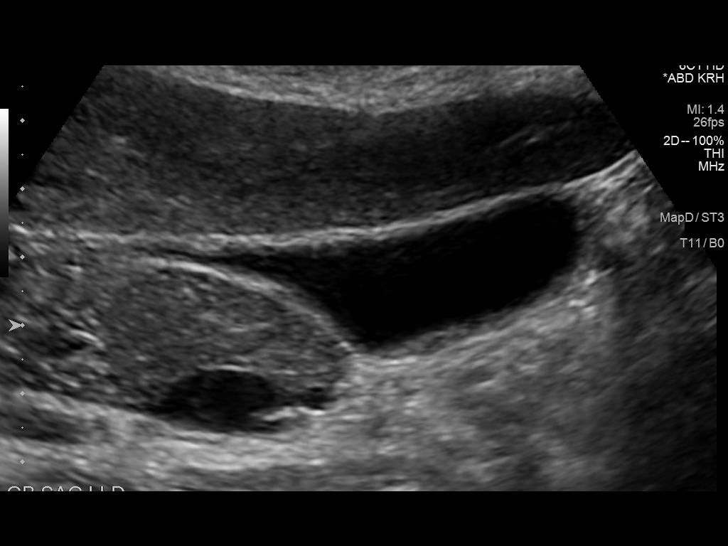
[im 45/72]
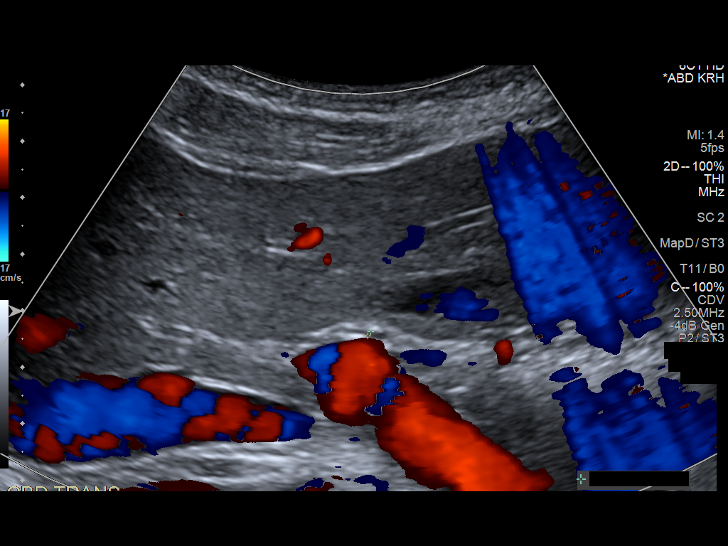
[im 48/72]
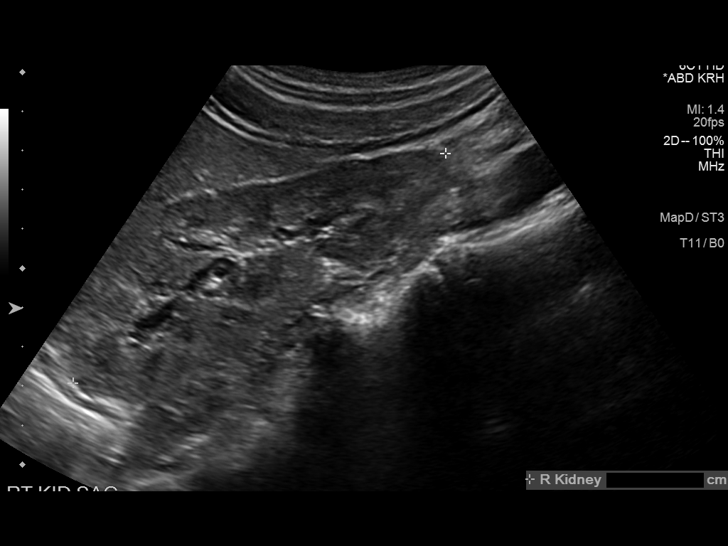
[im 54/72]
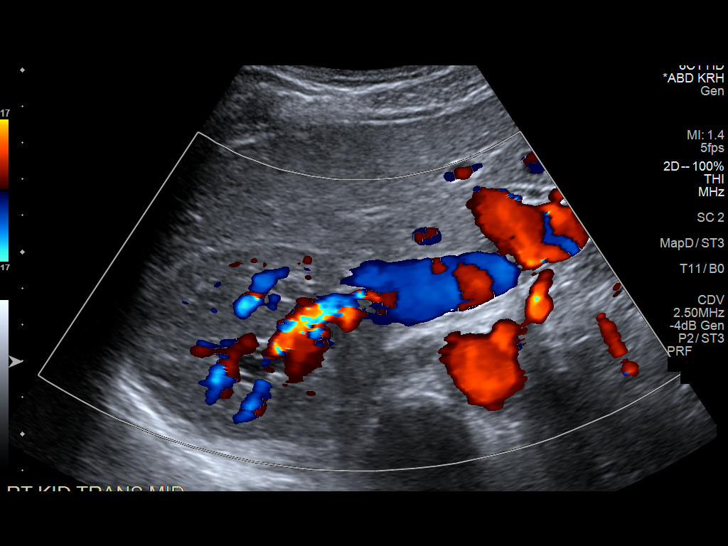
[im 60/72]
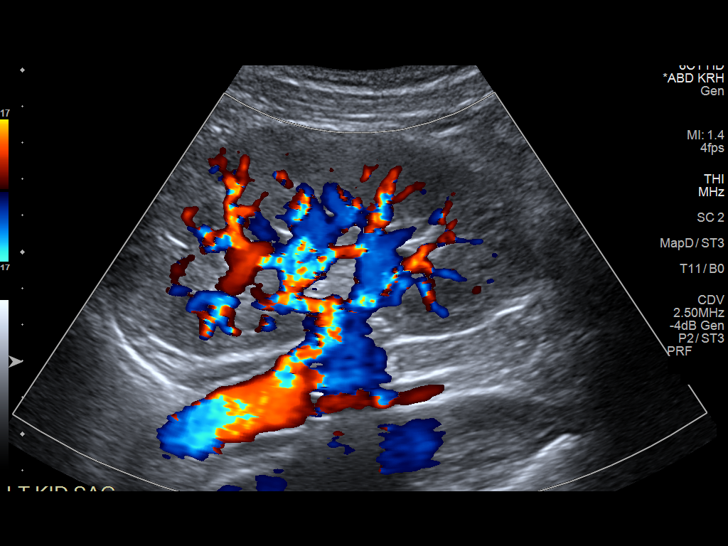
[im 66/72]
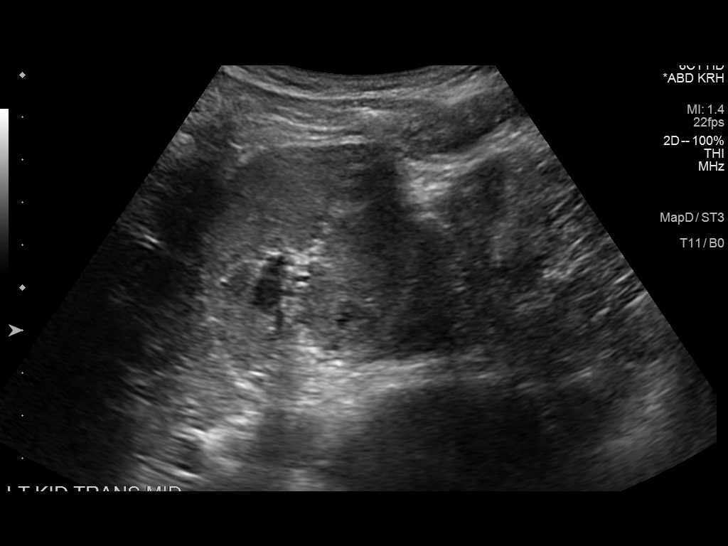
[im 72/72]
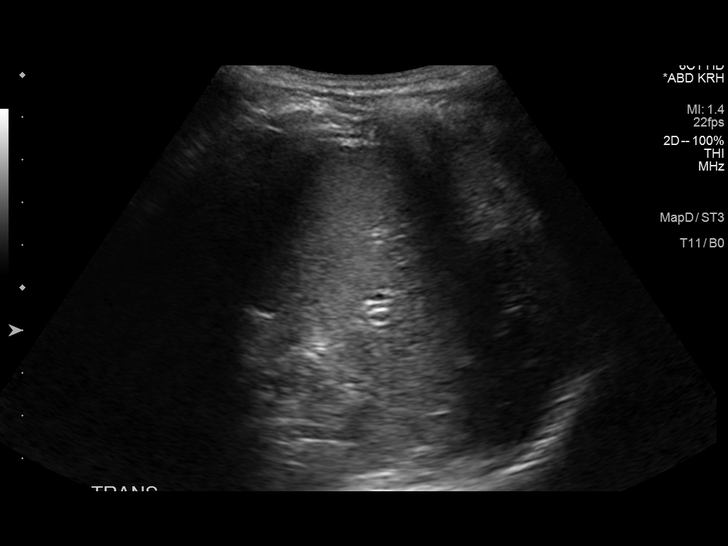

[14 of 25 positions shown; findings below may reference images not displayed]

FINDINGS: Gallbladder: Normally distended without stones or wall thickening.
No pericholecystic fluid or sonographic Murphy sign. Question
minimal dependent sludge.

Common bile duct: Diameter: Normal caliber 1 mm diameter

Liver: Normal appearance

IVC: Normal appearance

Pancreas: Normal appearance

Spleen: Normal appearance, 6.8 cm length

Right Kidney: Length: 11.1 cm. Normal morphology without mass or
hydronephrosis.

Left Kidney: Length: 11.8 cm. Normal morphology without mass or
hydronephrosis.

Abdominal aorta: Normal caliber

Other findings: No free-fluid
IMPRESSION: No significant upper abdominal sonographic abnormalities identified.

Question minimal dependent sludge in gallbladder.

## 2015-06-13 ENCOUNTER — Emergency Department (HOSPITAL_BASED_OUTPATIENT_CLINIC_OR_DEPARTMENT_OTHER): Payer: Self-pay

## 2015-06-13 ENCOUNTER — Emergency Department (HOSPITAL_BASED_OUTPATIENT_CLINIC_OR_DEPARTMENT_OTHER)
Admission: EM | Admit: 2015-06-13 | Discharge: 2015-06-13 | Disposition: A | Discharge status: Home / Self Care | Payer: 59 | Attending: Emergency Medicine | Admitting: Emergency Medicine

## 2015-06-13 ENCOUNTER — Encounter (HOSPITAL_BASED_OUTPATIENT_CLINIC_OR_DEPARTMENT_OTHER): Payer: Self-pay | Admitting: *Deleted

## 2015-06-13 DIAGNOSIS — Z79899 Other long term (current) drug therapy: Secondary | ICD-10-CM | POA: Insufficient documentation

## 2015-06-13 DIAGNOSIS — Z3202 Encounter for pregnancy test, result negative: Secondary | ICD-10-CM | POA: Insufficient documentation

## 2015-06-13 DIAGNOSIS — Z8619 Personal history of other infectious and parasitic diseases: Secondary | ICD-10-CM | POA: Insufficient documentation

## 2015-06-13 DIAGNOSIS — Z720 Tobacco use: Secondary | ICD-10-CM | POA: Insufficient documentation

## 2015-06-13 DIAGNOSIS — R1031 Right lower quadrant pain: Secondary | ICD-10-CM | POA: Insufficient documentation

## 2015-06-13 DIAGNOSIS — R509 Fever, unspecified: Secondary | ICD-10-CM | POA: Insufficient documentation

## 2015-06-13 DIAGNOSIS — R1030 Lower abdominal pain, unspecified: Secondary | ICD-10-CM

## 2015-06-13 DIAGNOSIS — R197 Diarrhea, unspecified: Secondary | ICD-10-CM | POA: Insufficient documentation

## 2015-06-13 DIAGNOSIS — R1032 Left lower quadrant pain: Secondary | ICD-10-CM | POA: Insufficient documentation

## 2015-06-13 DIAGNOSIS — R112 Nausea with vomiting, unspecified: Secondary | ICD-10-CM | POA: Insufficient documentation

## 2015-06-13 DIAGNOSIS — Z792 Long term (current) use of antibiotics: Secondary | ICD-10-CM | POA: Insufficient documentation

## 2015-06-13 DIAGNOSIS — Z87448 Personal history of other diseases of urinary system: Secondary | ICD-10-CM | POA: Insufficient documentation

## 2015-06-13 HISTORY — DX: Enterocolitis due to Clostridium difficile, not specified as recurrent: A04.72

## 2015-06-13 LAB — CBC
HCT: 37.2 % (ref 36.0–46.0)
HEMOGLOBIN: 12.5 g/dL (ref 12.0–15.0)
MCH: 32.1 pg (ref 26.0–34.0)
MCHC: 33.6 g/dL (ref 30.0–36.0)
MCV: 95.6 fL (ref 78.0–100.0)
PLATELETS: 212 10*3/uL (ref 150–400)
RBC: 3.89 MIL/uL (ref 3.87–5.11)
RDW: 12.2 % (ref 11.5–15.5)
WBC: 5.9 10*3/uL (ref 4.0–10.5)

## 2015-06-13 LAB — URINE MICROSCOPIC-ADD ON

## 2015-06-13 LAB — URINALYSIS, ROUTINE W REFLEX MICROSCOPIC
BILIRUBIN URINE: NEGATIVE
GLUCOSE, UA: NEGATIVE mg/dL
HGB URINE DIPSTICK: NEGATIVE
Ketones, ur: NEGATIVE mg/dL
Nitrite: NEGATIVE
PH: 6 (ref 5.0–8.0)
Protein, ur: NEGATIVE mg/dL
SPECIFIC GRAVITY, URINE: 1.021 (ref 1.005–1.030)
UROBILINOGEN UA: 0.2 mg/dL (ref 0.0–1.0)

## 2015-06-13 LAB — COMPREHENSIVE METABOLIC PANEL
ALT: 11 U/L — ABNORMAL LOW (ref 14–54)
AST: 17 U/L (ref 15–41)
Albumin: 3.7 g/dL (ref 3.5–5.0)
Alkaline Phosphatase: 52 U/L (ref 38–126)
Anion gap: 6 (ref 5–15)
BUN: 13 mg/dL (ref 6–20)
CALCIUM: 8.6 mg/dL — AB (ref 8.9–10.3)
CHLORIDE: 106 mmol/L (ref 101–111)
CO2: 27 mmol/L (ref 22–32)
CREATININE: 0.66 mg/dL (ref 0.44–1.00)
GLUCOSE: 103 mg/dL — AB (ref 65–99)
Potassium: 3.4 mmol/L — ABNORMAL LOW (ref 3.5–5.1)
Sodium: 139 mmol/L (ref 135–145)
Total Bilirubin: 0.5 mg/dL (ref 0.3–1.2)
Total Protein: 6.6 g/dL (ref 6.5–8.1)

## 2015-06-13 LAB — PREGNANCY, URINE: Preg Test, Ur: NEGATIVE

## 2015-06-13 LAB — LIPASE, BLOOD: Lipase: 27 U/L (ref 22–51)

## 2015-06-13 MED ORDER — METRONIDAZOLE 500 MG PO TABS: 500.0000 mg | ORAL_TABLET | Freq: Two times a day (BID) | ORAL | Status: DC

## 2015-06-13 MED ORDER — SODIUM CHLORIDE 0.9 % IV BOLUS (SEPSIS)
1000.0000 mL | Freq: Once | INTRAVENOUS | Status: AC
  Administered 2015-06-13: 1000 mL via INTRAVENOUS

## 2015-06-13 NOTE — ED Notes (Signed)
Pt given pilgrim hat and specimen container for stool sample. Pt states "can I have a spicy cheetoh, that will make me have to go..." pt informed that she is npo until all tests are back. Pt verbalizes understanding.

## 2015-06-13 NOTE — ED Notes (Signed)
Pt c/o diarrhea x 2 weeks HX c diff

## 2015-06-13 NOTE — Discharge Instructions (Signed)
Take Flagyl twice daily as directed. Follow-up with your gastroenterologist as soon as possible. Call their office tomorrow to schedule an appointment to be seen as soon as possible.  Diarrhea Diarrhea is frequent loose and watery bowel movements. It can cause you to feel weak and dehydrated. Dehydration can cause you to become tired and thirsty, have a dry mouth, and have decreased urination that often is dark yellow. Diarrhea is a sign of another problem, most often an infection that will not last long. In most cases, diarrhea typically lasts 2-3 days. However, it can last longer if it is a sign of something more serious. It is important to treat your diarrhea as directed by your caregiver to lessen or prevent future episodes of diarrhea. CAUSES  Some common causes include:  Gastrointestinal infections caused by viruses, bacteria, or parasites.  Food poisoning or food allergies.  Certain medicines, such as antibiotics, chemotherapy, and laxatives.  Artificial sweeteners and fructose.  Digestive disorders. HOME CARE INSTRUCTIONS  Ensure adequate fluid intake (hydration): Have 1 cup (8 oz) of fluid for each diarrhea episode. Avoid fluids that contain simple sugars or sports drinks, fruit juices, whole milk products, and sodas. Your urine should be clear or pale yellow if you are drinking enough fluids. Hydrate with an oral rehydration solution that you can purchase at pharmacies, retail stores, and online. You can prepare an oral rehydration solution at home by mixing the following ingredients together:   - tsp table salt.   tsp baking soda.   tsp salt substitute containing potassium chloride.  1  tablespoons sugar.  1 L (34 oz) of water.  Certain foods and beverages may increase the speed at which food moves through the gastrointestinal (GI) tract. These foods and beverages should be avoided and include:  Caffeinated and alcoholic beverages.  High-fiber foods, such as raw fruits  and vegetables, nuts, seeds, and whole grain breads and cereals.  Foods and beverages sweetened with sugar alcohols, such as xylitol, sorbitol, and mannitol.  Some foods may be well tolerated and may help thicken stool including:  Starchy foods, such as rice, toast, pasta, low-sugar cereal, oatmeal, grits, baked potatoes, crackers, and bagels.  Bananas.  Applesauce.  Add probiotic-rich foods to help increase healthy bacteria in the GI tract, such as yogurt and fermented milk products.  Wash your hands well after each diarrhea episode.  Only take over-the-counter or prescription medicines as directed by your caregiver.  Take a warm bath to relieve any burning or pain from frequent diarrhea episodes. SEEK IMMEDIATE MEDICAL CARE IF:   You are unable to keep fluids down.  You have persistent vomiting.  You have blood in your stool, or your stools are black and tarry.  You do not urinate in 6-8 hours, or there is only a small amount of very dark urine.  You have abdominal pain that increases or localizes.  You have weakness, dizziness, confusion, or light-headedness.  You have a severe headache.  Your diarrhea gets worse or does not get better.  You have a fever or persistent symptoms for more than 2-3 days.  You have a fever and your symptoms suddenly get worse. MAKE SURE YOU:   Understand these instructions.  Will watch your condition.  Will get help right away if you are not doing well or get worse. Document Released: 11/27/2002 Document Revised: 04/23/2014 Document Reviewed: 08/14/2012 [facility] Patient Information 2015 [place], [place]. This information is not intended to replace advice given to you by your health care provider.  Make sure you discuss any questions you have with your health care provider.  Abdominal Pain, Women Abdominal (stomach, pelvic, or belly) pain can be caused by many things. It is important to tell your doctor:  The location of the  pain.  Does it come and go or is it present all the time?  Are there things that start the pain (eating certain foods, exercise)?  Are there other symptoms associated with the pain (fever, nausea, vomiting, diarrhea)? All of this is helpful to know when trying to find the cause of the pain. CAUSES   Stomach: virus or bacteria infection, or ulcer.  Intestine: appendicitis (inflamed appendix), regional ileitis (Crohn's disease), ulcerative colitis (inflamed colon), irritable bowel syndrome, diverticulitis (inflamed diverticulum of the colon), or cancer of the stomach or intestine.  Gallbladder disease or stones in the gallbladder.  Kidney disease, kidney stones, or infection.  Pancreas infection or cancer.  Fibromyalgia (pain disorder).  Diseases of the female organs:  Uterus: fibroid (non-cancerous) tumors or infection.  Fallopian tubes: infection or tubal pregnancy.  Ovary: cysts or tumors.  Pelvic adhesions (scar tissue).  Endometriosis (uterus lining tissue growing in the pelvis and on the pelvic organs).  Pelvic congestion syndrome (female organs filling up with blood just before the menstrual period).  Pain with the menstrual period.  Pain with ovulation (producing an egg).  Pain with an IUD (intrauterine device, birth control) in the uterus.  Cancer of the female organs.  Functional pain (pain not caused by a disease, may improve without treatment).  Psychological pain.  Depression. DIAGNOSIS  Your doctor will decide the seriousness of your pain by doing an examination.  Blood tests.  X-rays.  Ultrasound.  CT scan (computed tomography, special type of X-ray).  MRI (magnetic resonance imaging).  Cultures, for infection.  Barium enema (dye inserted in the large intestine, to better view it with X-rays).  Colonoscopy (looking in intestine with a lighted tube).  Laparoscopy (minor surgery, looking in abdomen with a lighted tube).  Major abdominal  exploratory surgery (looking in abdomen with a large incision). TREATMENT  The treatment will depend on the cause of the pain.   Many cases can be observed and treated at home.  Over-the-counter medicines recommended by your caregiver.  Prescription medicine.  Antibiotics, for infection.  Birth control pills, for painful periods or for ovulation pain.  Hormone treatment, for endometriosis.  Nerve blocking injections.  Physical therapy.  Antidepressants.  Counseling with a psychologist or psychiatrist.  Minor or major surgery. HOME CARE INSTRUCTIONS   Do not take laxatives, unless directed by your caregiver.  Take over-the-counter pain medicine only if ordered by your caregiver. Do not take aspirin because it can cause an upset stomach or bleeding.  Try a clear liquid diet (broth or water) as ordered by your caregiver. Slowly move to a bland diet, as tolerated, if the pain is related to the stomach or intestine.  Have a thermometer and take your temperature several times a day, and record it.  Bed rest and sleep, if it helps the pain.  Avoid sexual intercourse, if it causes pain.  Avoid stressful situations.  Keep your follow-up appointments and tests, as your caregiver orders.  If the pain does not go away with medicine or surgery, you may try:  Acupuncture.  Relaxation exercises (yoga, meditation).  Group therapy.  Counseling. SEEK MEDICAL CARE IF:   You notice certain foods cause stomach pain.  Your home care treatment is not helping your pain.  You  need stronger pain medicine.  You want your IUD removed.  You feel faint or lightheaded.  You develop nausea and vomiting.  You develop a rash.  You are having side effects or an allergy to your medicine. SEEK IMMEDIATE MEDICAL CARE IF:   Your pain does not go away or gets worse.  You have a fever.  Your pain is felt only in portions of the abdomen. The right side could possibly be appendicitis.  The left lower portion of the abdomen could be colitis or diverticulitis.  You are passing blood in your stools (bright red or black tarry stools, with or without vomiting).  You have blood in your urine.  You develop chills, with or without a fever.  You pass out. MAKE SURE YOU:   Understand these instructions.  Will watch your condition.  Will get help right away if you are not doing well or get worse. Document Released: 10/04/2007 Document Revised: 04/23/2014 Document Reviewed: 10/24/2009 [facility] Patient Information 2015 [place], [place]. This information is not intended to replace advice given to you by your health care provider. Make sure you discuss any questions you have with your health care provider.

## 2015-06-13 NOTE — ED Provider Notes (Signed)
CSN: [id number]     Arrival date & time 06/13/15  1528 History   First MD Initiated Contact with Patient 06/13/15 1549     Chief Complaint  Patient presents with  . Diarrhea     (Consider location/radiation/quality/duration/timing/severity/associated sxs/prior Treatment) HPI Comments: 29 year old female presenting with diarrhea 2 weeks. Reports between 3-6 episodes of diarrhea daily. Initially, her diarrhea was formed and is now become very loose, with occasional bright red blood. She had similar symptoms towards the end of last year, was evaluated in the emergency department and followed up with gastroenterology. She was found to have C. Difficile and was treated, had an upper endoscopy but no colonoscopy. States they could never figure out why she had the diarrhea other than C. Difficile. Admits to associated left lower abdominal pain described as cramping, occasionally sharp, radiating towards her left lower back. No aggravating or alleviating factors. She has been trying to drink a lot of Gatorade to keep hydrated. Admits to subjective fevers along with nausea and 4 episodes of nonbloody, nonbilious emesis over the past 2 weeks. Has not been on antibiotics in the past few months, and no recent hospital admissions. States she has been losing weight which happened the last time she had diarrhea, weight loss of about 20 pounds.  Patient is a 29 y.o. female presenting with diarrhea. The history is provided by the patient.  Diarrhea Associated symptoms: abdominal pain, chills, fever and vomiting     Past Medical History  Diagnosis Date  . Kidney infection   . C. difficile colitis    History reviewed. No pertinent past surgical history. History reviewed. No pertinent family history. History  Substance Use Topics  . Smoking status: Current Every Day Smoker -- 0.50 packs/day    Types: Cigarettes  . Smokeless tobacco: Not on file  . Alcohol Use: Yes     Comment: daily   OB History    No  data available     Review of Systems  Constitutional: Positive for fever and chills.  Gastrointestinal: Positive for nausea, vomiting, abdominal pain, diarrhea and blood in stool.  All other systems reviewed and are negative.     Allergies  Review of patient's allergies indicates no known allergies.  Home Medications   Prior to Admission medications   Medication Sig Start Date End Date Taking? Authorizing Provider  ALPRAZolam Prudy Feeler) 0.5 MG tablet Take 0.5 mg by mouth at bedtime as needed for anxiety.    Historical Provider, MD  cephALEXin (KEFLEX) 500 MG capsule Take 1 capsule (500 mg total) by mouth 3 (three) times daily. 01/02/15   Gilda Crease, MD  citalopram (CELEXA) 10 MG tablet Take 10 mg by mouth daily.    Historical Provider, MD  clonazePAM (KLONOPIN) 0.5 MG tablet Take 0.5 mg by mouth 2 (two) times daily as needed for anxiety.    Historical Provider, MD  dicyclomine (BENTYL) 20 MG tablet Take 20 mg by mouth every 6 (six) hours.    Historical Provider, MD  levonorgestrel (MIRENA) 20 MCG/24HR IUD 1 each by Intrauterine route once.    Historical Provider, MD  metroNIDAZOLE (FLAGYL) 500 MG tablet Take 1 tablet (500 mg total) by mouth 2 (two) times daily. One po bid x 7 days 06/13/15   Kathrynn Speed, PA-C  omeprazole (PRILOSEC) 40 MG capsule Take 40 mg by mouth daily.    Historical Provider, MD  ondansetron (ZOFRAN) 4 MG tablet Take 4 mg by mouth every 8 (eight) hours as needed for nausea  or vomiting.    Historical Provider, MD  pantoprazole (PROTONIX) 20 MG tablet Take 2 tablets (40 mg total) by mouth daily. 11/05/14   Toy Cookey, MD  sucralfate (CARAFATE) 1 G tablet Take 1 g by mouth 4 (four) times daily -  with meals and at bedtime.    Historical Provider, MD   BP 108/65 mmHg  Pulse 96  Temp(Src) 98.1 F (36.7 C) (Oral)  Resp 16  Ht  (1.626 m)  SpO2 100% Physical Exam  Constitutional: She is oriented to person, place, and time. She appears well-developed  and well-nourished. No distress.  HENT:  Head: Normocephalic and atraumatic.  Mouth/Throat: Oropharynx is clear and moist.  Eyes: Conjunctivae and EOM are normal.  Neck: Normal range of motion. Neck supple.  Cardiovascular: Normal rate, regular rhythm and normal heart sounds.   Pulmonary/Chest: Effort normal and breath sounds normal. No respiratory distress.  Abdominal: Normal appearance and bowel sounds are normal. She exhibits no distension. There is tenderness in the right lower quadrant and left lower quadrant. There is no rigidity, no rebound, no guarding, no CVA tenderness, no tenderness at McBurney's point and negative Murphy's sign.  Genitourinary:  Deferred.  Musculoskeletal: Normal range of motion. She exhibits no edema.  Neurological: She is alert and oriented to person, place, and time. No sensory deficit.  Skin: Skin is warm and dry.  Psychiatric: She has a normal mood and affect. Her behavior is normal.  Nursing note and vitals reviewed.   ED Course  Procedures (including critical care time) Labs Review Labs Reviewed  COMPREHENSIVE METABOLIC PANEL - Abnormal; Notable for the following:    Potassium 3.4 (*)    Glucose, Bld 103 (*)    Calcium 8.6 (*)    ALT 11 (*)    All other components within normal limits  URINALYSIS, ROUTINE W REFLEX MICROSCOPIC (NOT AT [facility]) - Abnormal; Notable for the following:    APPearance CLOUDY (*)    Leukocytes, UA SMALL (*)    All other components within normal limits  URINE MICROSCOPIC-ADD ON - Abnormal; Notable for the following:    Squamous Epithelial / LPF FEW (*)    Bacteria, UA MANY (*)    All other components within normal limits  STOOL CULTURE  CLOSTRIDIUM DIFFICILE BY PCR (NOT AT [facility])  OVA AND PARASITE EXAMINATION  CBC  LIPASE, BLOOD  PREGNANCY, URINE  OCCULT BLOOD X 1 CARD TO LAB, STOOL    Imaging Review No results found.   EKG Interpretation None      MDM   Final diagnoses:  Diarrhea  Lower abdominal pain    Nontoxic appearing, NAD. AF VSS. Abdomen is soft with no peritoneal signs. She does have tenderness across her lower abdomen. History of C. Difficile as stated above. No recent and a biotic use and no recent hospital admissions. Patient unable to provide a stool sample here in the ED. CT was ordered to rule out any acute cause of the patient's continuous diarrhea. Patient does not want CT scan at this time. CT scan canceled, and given that there are no peritoneal signs and no fever, CT scan was canceled. Labs without acute finding.patient was given IV fluids for rehydration. Discussed importance of follow-up with her gastroenterologist, and advised her to call the office tomorrow to schedule appointment to be seen as soon as possible. Patient may need a colonoscopy at this point. No acute finding that would require admission to the hospital at this time. Stable for discharge. Return  precautions given. Patient states understanding of treatment care plan and is agreeable.  Discussed with attending Dr. Manus Gunning who also evaluated patient and agrees with plan of care.   Kathrynn Speed, PA-C 06/13/15 1908  Kathrynn Speed, PA-C 06/13/15 1928  Kathrynn Speed, PA-C 06/13/15 1929  Glynn Octave, MD 06/14/15 [phone]

## 2019-10-01 ENCOUNTER — Emergency Department (HOSPITAL_BASED_OUTPATIENT_CLINIC_OR_DEPARTMENT_OTHER)
Admission: EM | Admit: 2019-10-01 | Discharge: 2019-10-01 | Disposition: A | Discharge status: Home / Self Care | Payer: 59 | Attending: Emergency Medicine | Admitting: Emergency Medicine

## 2019-10-01 ENCOUNTER — Other Ambulatory Visit: Payer: Self-pay

## 2019-10-01 ENCOUNTER — Encounter (HOSPITAL_BASED_OUTPATIENT_CLINIC_OR_DEPARTMENT_OTHER): Payer: Self-pay | Admitting: *Deleted

## 2019-10-01 DIAGNOSIS — T148XXA Other injury of unspecified body region, initial encounter: Secondary | ICD-10-CM

## 2019-10-01 DIAGNOSIS — S161XXA Strain of muscle, fascia and tendon at neck level, initial encounter: Secondary | ICD-10-CM | POA: Insufficient documentation

## 2019-10-01 DIAGNOSIS — F17210 Nicotine dependence, cigarettes, uncomplicated: Secondary | ICD-10-CM | POA: Insufficient documentation

## 2019-10-01 DIAGNOSIS — Y999 Unspecified external cause status: Secondary | ICD-10-CM | POA: Insufficient documentation

## 2019-10-01 DIAGNOSIS — Z793 Long term (current) use of hormonal contraceptives: Secondary | ICD-10-CM | POA: Insufficient documentation

## 2019-10-01 DIAGNOSIS — Y92410 Unspecified street and highway as the place of occurrence of the external cause: Secondary | ICD-10-CM | POA: Insufficient documentation

## 2019-10-01 DIAGNOSIS — Y93I9 Activity, other involving external motion: Secondary | ICD-10-CM | POA: Insufficient documentation
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
## 2019-10-12 ENCOUNTER — Ambulatory Visit: Payer: Self-pay

## 2019-10-12 ENCOUNTER — Other Ambulatory Visit: Payer: Self-pay

## 2023-01-27 ENCOUNTER — Other Ambulatory Visit: Payer: Self-pay

## 2023-01-27 ENCOUNTER — Emergency Department (HOSPITAL_BASED_OUTPATIENT_CLINIC_OR_DEPARTMENT_OTHER): Payer: Self-pay

## 2023-01-27 ENCOUNTER — Emergency Department (HOSPITAL_BASED_OUTPATIENT_CLINIC_OR_DEPARTMENT_OTHER)
Admission: EM | Admit: 2023-01-27 | Discharge: 2023-01-27 | Disposition: A | Discharge status: Home / Self Care | Payer: Self-pay | Attending: Emergency Medicine | Admitting: Emergency Medicine

## 2023-01-27 DIAGNOSIS — R0789 Other chest pain: Secondary | ICD-10-CM | POA: Insufficient documentation

## 2023-01-27 DIAGNOSIS — R000 Tachycardia, unspecified: Secondary | ICD-10-CM | POA: Insufficient documentation

## 2023-01-27 DIAGNOSIS — Z20822 Contact with and (suspected) exposure to covid-19: Secondary | ICD-10-CM | POA: Insufficient documentation

## 2023-01-27 DIAGNOSIS — R079 Chest pain, unspecified: Secondary | ICD-10-CM

## 2023-01-27 LAB — BASIC METABOLIC PANEL
Anion gap: 7 (ref 5–15)
BUN: 12 mg/dL (ref 6–20)
CO2: 24 mmol/L (ref 22–32)
Calcium: 8.8 mg/dL — ABNORMAL LOW (ref 8.9–10.3)
Chloride: 104 mmol/L (ref 98–111)
Creatinine, Ser: 0.68 mg/dL (ref 0.44–1.00)
GFR, Estimated: >60 mL/min (ref >60)
Glucose, Bld: 85 mg/dL (ref 70–99)
Potassium: 3.7 mmol/L (ref 3.5–5.1)
Sodium: 135 mmol/L (ref 135–145)

## 2023-01-27 LAB — CBC
HCT: 38.7 % (ref 36.0–46.0)
Hemoglobin: 13 g/dL (ref 12.0–15.0)
MCH: 31.8 pg (ref 26.0–34.0)
MCHC: 33.6 g/dL (ref 30.0–36.0)
MCV: 94.6 fL (ref 80.0–100.0)
Platelets: 220 10*3/uL (ref 150–400)
RBC: 4.09 MIL/uL (ref 3.87–5.11)
RDW: 12.5 % (ref 11.5–15.5)
WBC: 6.1 10*3/uL (ref 4.0–10.5)
nRBC: 0 % (ref 0.0–0.2)

## 2023-01-27 LAB — RESP PANEL BY RT-PCR (RSV, FLU A&B, COVID)  RVPGX2
Influenza A by PCR: NEGATIVE
Influenza B by PCR: NEGATIVE
Resp Syncytial Virus by PCR: NEGATIVE
SARS Coronavirus 2 by RT PCR: NEGATIVE

## 2023-01-27 LAB — PREGNANCY, URINE: Preg Test, Ur: NEGATIVE

## 2023-01-27 LAB — TROPONIN I (HIGH SENSITIVITY)
Troponin I (High Sensitivity): <2 ng/L (ref <18)
Troponin I (High Sensitivity): <2 ng/L (ref <18)

## 2023-01-27 LAB — D-DIMER, QUANTITATIVE: D-Dimer, Quant: 0.34 ug/mL-FEU (ref 0.00–0.50)

## 2023-01-27 NOTE — ED Provider Notes (Signed)
[place] [facility] Provider Note   CSN: [id number] Arrival date & time: 01/27/23  1339     History  Chief Complaint  Patient presents with   Chest Pain    Olivia Adams is a 37 y.o. female.   Chest Pain    This is a 37 year old female presenting to the emergency department due to chest pain.  Started 2 weeks ago, its intermittent.  She has it at rest and with exertion.  Character changes from sharp chest pressure to sometimes stabbing pain.  It occasionally radiates to the shoulder or across the chest.  She denies any nausea, vomiting, shortness of breath.  No recent travel or surgeries, no history of PE.  She is on hormonal birth control IUD.  She does have a strong family history of CAD but does not take any medicine of hypertension, lipidemia or cholesterol.  She does smoke cigarettes daily.  Denies any cough, abdominal pain, lower extremity swelling.  Home Medications Prior to Admission medications   Medication Sig Start Date End Date Taking? Authorizing Provider  levonorgestrel (MIRENA) 20 MCG/24HR IUD 1 each by Intrauterine route once.    [provider]      Allergies    Patient has no known allergies.    Review of Systems   Review of Systems  Cardiovascular:  Positive for chest pain.    Physical Exam Updated Vital Signs BP 106/74   Pulse 70   Temp 99 F (37.2 C) (Oral)   Resp 19   Ht 5\' 4"  (1.626 m)   Wt 53.5 kg   SpO2 100%   BMI 20.25 kg/m  Physical Exam Vitals and nursing note reviewed. Exam conducted with a chaperone present.  Constitutional:      Appearance: Normal appearance.  HENT:     Head: Normocephalic and atraumatic.  Eyes:     General: No scleral icterus.       Right eye: No discharge.        Left eye: No discharge.     Extraocular Movements: Extraocular movements intact.     Pupils: Pupils are equal, round, and reactive to light.  Cardiovascular:     Rate and Rhythm: Regular  rhythm. Tachycardia present.     Pulses: Normal pulses.     Heart sounds: Normal heart sounds. No murmur heard.    No friction rub. No gallop.     Comments: Mildly tachycardic with regular rhythm.  Upper and lower extremity pulses are symmetric bilaterally Pulmonary:     Effort: Pulmonary effort is normal. No respiratory distress.     Breath sounds: Normal breath sounds.  Abdominal:     General: Abdomen is flat. Bowel sounds are normal. There is no distension.     Palpations: Abdomen is soft.     Tenderness: There is no abdominal tenderness.  Skin:    General: Skin is warm and dry.     Coloration: Skin is not jaundiced.  Neurological:     Mental Status: She is alert. Mental status is at baseline.     Coordination: Coordination normal.     ED Results / Procedures / Treatments   Labs (all labs ordered are listed, but only abnormal results are displayed) Labs Reviewed  BASIC METABOLIC PANEL - Abnormal; Notable for the following components:      Result Value   Calcium 8.8 (*)    All other components within normal limits  RESP PANEL BY RT-PCR (RSV, FLU A&B, COVID)  RVPGX2  PREGNANCY, URINE  CBC  D-DIMER, QUANTITATIVE  TROPONIN I (HIGH SENSITIVITY)  TROPONIN I (HIGH SENSITIVITY)    EKG EKG Interpretation  Date/Time:  Wednesday January 27 2023 13:48:26 EST Ventricular Rate:  88 PR Interval:  140 QRS Duration: 92 QT Interval:  344 QTC Calculation: 417 R Axis:   -49 Text Interpretation: Sinus rhythm Left anterior fascicular block No significant change since prior 1/16 Confirmed by Aletta Edouard [phone]) on 01/27/2023 3:38:46 PM  Radiology DG Chest 2 View  Result Date: 01/27/2023 CLINICAL DATA:  Chest pain EXAM: CHEST - 2 VIEW COMPARISON:  None Available. FINDINGS: The heart size and mediastinal contours are within normal limits. Both lungs are clear. The visualized skeletal structures are unremarkable. IMPRESSION: No active cardiopulmonary disease. Electronically Signed    By: Yetta Glassman M.D.   On: 01/27/2023 14:21    Procedures Procedures    Medications Ordered in ED Medications - No data to display  ED Course/ Medical Decision Making/ A&P Clinical Course as of 01/27/23 1746  Wed Jan 27, 2023  1533 CBC [HS]  1618 D-Dimer, Quant: 0.34 [CG]  1625 Troponin I (High Sensitivity) Troponin is less than 2 [HS]  4287 Basic metabolic panel(!) BMP without any gross electrolyte derangement or AKI [HS]  1625 ED EKG Sinus with biphasic T waves in leads III, significantly changed compared to previous EKGs.  On cardiac monitoring she is in sinus rhythm in the room although she was initially tachycardic on evaluation. [HS]  6811 DG Chest 2 View No widened mediastinum, consolidation or pneumothorax.  Normal cardiac silhouette. [HS]  1649 CBC No leukocytosis or underlying anemia [HS]    Clinical Course User Index [CG] Nelwyn Salisbury, [place], Student-PA [HS] Sherrill Raring, PA-C                             Medical Decision Making Amount and/or Complexity of Data Reviewed Labs: ordered. Decision-making details documented in ED Course. Radiology: ordered. Decision-making details documented in ED Course. ECG/medicine tests:  Decision-making details documented in ED Course.   This is a 89 old female presenting to the emergency department due to chest pain.  Differential includes ACS, PE, pneumonia, pneumothorax, dissection, pericarditis, myocarditis, atypical reflux, esophageal rupture.  No On exam patient is overall well-appearing although mildly tachycardic.  Cannot PERC out given mildly tachycardic and hormonal birth control use.  Will proceed with laboratory workup including a dimer, chest x-ray and EKG and then reevaluate.  Will also check a COVID testing temperature is slightly elevated 99 although not febrile.  I recommended interpreted laboratory workup as documented ED course.  Not consistent with PE given negative dimer, I doubt this is ACS, heart score is only  1 and troponin was less than 2 symptoms any indication for further workup there.  Presentation also does not sound consistent with pericarditis given not really having any viral illness.    Outpatient follow-up with her primary, stable for discharge at this time.          Final Clinical Impression(s) / ED Diagnoses Final diagnoses:  Nonspecific chest pain    Rx / DC Orders ED Discharge Orders     None         Sherrill Raring, [place] 01/27/23 1746    Hayden Rasmussen, MD 01/28/23 1009

## 2023-01-27 NOTE — ED Triage Notes (Signed)
Patient presents to ED via POV from home. Here with chest pain x 2 weeks. Reports pain radiates down right arm. Denies nausea/vomiting.

## 2023-01-27 NOTE — Discharge Instructions (Signed)
Take Tylenol Motrin for pain.  X-ray was reassuringly normal.  Follow-up with your primary for reevaluation.  Return to the edition exam is constant, you have loss of consciousness, shortness of breath, coughing up blood or new or concerning symptoms.
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
